# Patient Record
Sex: Female | Born: 1945 | Race: Black or African American | Hispanic: No | Smoking: Current every day smoker
Health system: Southern US, Community
[De-identification: ages and names within clinical notes are randomized; demographics above are authoritative.]

## PROBLEM LIST (undated history)

## (undated) DIAGNOSIS — G4730 Sleep apnea, unspecified: Secondary | ICD-10-CM

## (undated) DIAGNOSIS — F419 Anxiety disorder, unspecified: Secondary | ICD-10-CM

## (undated) DIAGNOSIS — K219 Gastro-esophageal reflux disease without esophagitis: Secondary | ICD-10-CM

## (undated) DIAGNOSIS — M1990 Unspecified osteoarthritis, unspecified site: Secondary | ICD-10-CM

## (undated) DIAGNOSIS — I10 Essential (primary) hypertension: Secondary | ICD-10-CM

## (undated) HISTORY — PX: HERNIA REPAIR: SHX51

## (undated) HISTORY — PX: BACK SURGERY: SHX140

## (undated) HISTORY — PX: CYST REMOVAL NECK: SHX6281

## (undated) HISTORY — PX: VERTEBRAL CORPECTOMY: SHX398

## (undated) HISTORY — PX: CARDIAC CATHETERIZATION: SHX172

---

## 2011-10-04 ENCOUNTER — Other Ambulatory Visit: Payer: Self-pay | Admitting: Neurosurgery

## 2011-10-04 DIAGNOSIS — M549 Dorsalgia, unspecified: Secondary | ICD-10-CM

## 2011-10-10 ENCOUNTER — Ambulatory Visit
Admission: RE | Admit: 2011-10-10 | Discharge: 2011-10-10 | Disposition: A | Discharge status: Home / Self Care | Payer: Medicare Other | Source: Ambulatory Visit | Attending: Neurosurgery | Admitting: Neurosurgery

## 2011-10-10 DIAGNOSIS — M549 Dorsalgia, unspecified: Secondary | ICD-10-CM

## 2011-10-10 MED ORDER — GADOBENATE DIMEGLUMINE 529 MG/ML IV SOLN
20.0000 mL | Freq: Once | INTRAVENOUS | Status: AC | PRN
  Administered 2011-10-10: 20 mL via INTRAVENOUS

## 2011-10-12 ENCOUNTER — Encounter (HOSPITAL_COMMUNITY): Payer: Self-pay | Admitting: Emergency Medicine

## 2011-10-12 ENCOUNTER — Emergency Department (HOSPITAL_COMMUNITY)
Admission: EM | Admit: 2011-10-12 | Discharge: 2011-10-12 | Disposition: A | Discharge status: Home / Self Care | Payer: Medicare Other | Attending: Emergency Medicine | Admitting: Emergency Medicine

## 2011-10-12 DIAGNOSIS — Z79899 Other long term (current) drug therapy: Secondary | ICD-10-CM | POA: Insufficient documentation

## 2011-10-12 DIAGNOSIS — I10 Essential (primary) hypertension: Secondary | ICD-10-CM | POA: Insufficient documentation

## 2011-10-12 DIAGNOSIS — E119 Type 2 diabetes mellitus without complications: Secondary | ICD-10-CM | POA: Insufficient documentation

## 2011-10-12 DIAGNOSIS — IMO0002 Reserved for concepts with insufficient information to code with codable children: Secondary | ICD-10-CM

## 2011-10-12 DIAGNOSIS — M545 Low back pain: Secondary | ICD-10-CM

## 2011-10-12 DIAGNOSIS — M5126 Other intervertebral disc displacement, lumbar region: Secondary | ICD-10-CM

## 2011-10-12 DIAGNOSIS — N289 Disorder of kidney and ureter, unspecified: Secondary | ICD-10-CM | POA: Insufficient documentation

## 2011-10-12 DIAGNOSIS — R52 Pain, unspecified: Secondary | ICD-10-CM | POA: Insufficient documentation

## 2011-10-12 DIAGNOSIS — Z9861 Coronary angioplasty status: Secondary | ICD-10-CM | POA: Insufficient documentation

## 2011-10-12 MED ORDER — METHOCARBAMOL 500 MG PO TABS
1000.0000 mg | ORAL_TABLET | Freq: Once | ORAL | Status: AC
  Administered 2011-10-12: 1000 mg via ORAL
  Filled 2011-10-12: qty 2

## 2011-10-12 MED ORDER — IBUPROFEN 800 MG PO TABS: 800.0000 mg | ORAL_TABLET | Freq: Three times a day (TID) | ORAL | Status: AC

## 2011-10-12 MED ORDER — METHYLPREDNISOLONE 4 MG PO KIT: PACK | ORAL | Status: AC

## 2011-10-12 MED ORDER — IBUPROFEN 400 MG PO TABS
800.0000 mg | ORAL_TABLET | Freq: Once | ORAL | Status: AC
  Administered 2011-10-12: 800 mg via ORAL
  Filled 2011-10-12: qty 2

## 2011-10-12 MED ORDER — OXYCODONE-ACETAMINOPHEN 5-325 MG PO TABS: 2.0000 | ORAL_TABLET | ORAL | Status: AC | PRN

## 2011-10-12 MED ORDER — HYDROMORPHONE HCL PF 1 MG/ML IJ SOLN
1.0000 mg | Freq: Once | INTRAMUSCULAR | Status: AC
  Administered 2011-10-12: 1 mg via INTRAMUSCULAR
  Filled 2011-10-12: qty 1

## 2011-10-12 NOTE — ED Notes (Signed)
Pulled Dilaudid and Ibuprofen.  Gave them to [place], Charity fundraiser to administer.  Patient currently in restroom.

## 2011-10-12 NOTE — ED Provider Notes (Signed)
History   This chart was scribed for Glynn Octave, MD by Toya Smothers. The patient was seen in room TR05C/TR05C. Patient's care was started at 1444.  CSN: [id number]  Arrival date & time 10/12/11  1444   First MD Initiated Contact with Patient 10/12/11 1504      Chief Complaint  Patient presents with  . Back Pain  . Leg Pain  . Knee Pain    Patient is a 66 y.o. female presenting with back pain, leg pain, and knee pain. The history is provided by the patient and a relative. No language interpreter was used.  Back Pain  Associated symptoms include leg pain. Pertinent negatives include no chest pain, no fever, no headaches, no abdominal pain, no dysuria and no weakness.  Leg Pain   Knee Pain Pertinent negatives include no chest pain, no abdominal pain, no headaches and no shortness of breath.    Brittany Poole is a 66 y.o. female who presents to the Emergency Department complaining of gradual onset moderate severe constant R sideback pain radiating down to leg, with associated leg pain, and knee pain onset 3 weeks ago. Symptoms have been gradually worsening in the past 2 days. Leg swelling has been worsenig for the past 2 weeks. Pt reports having increasing trouble walking. Pt reports that she had been taking hydrocodone with moderate relief, though she has ran out of her prescription and is currently without. Dr. Arletha Grippe had her on a prednisone taper of which she finished 2 weeks ago. H/o back surgery in 2009. Pt lists h/o diabetes, high blood pressure, and stent placements within the heart.  Pt reports that she saw Dr. Silvestre Mesi one month ago.   No past medical history on file.  No past surgical history on file.  No family history on file.  History  Substance Use Topics  . Smoking status: Not on file  . Smokeless tobacco: Not on file  . Alcohol Use: Not on file   Review of Systems  Constitutional: Negative for fever.  HENT: Negative for hearing loss, rhinorrhea and neck pain.     Eyes: Negative for pain.  Respiratory: Negative for cough and shortness of breath.   Cardiovascular: Positive for leg swelling. Negative for chest pain.  Gastrointestinal: Positive for vomiting. Negative for nausea, abdominal pain, diarrhea and rectal pain.  Genitourinary: Negative for dysuria.  Musculoskeletal: Positive for back pain, joint swelling (Ankles bilaterally), arthralgias (knee pain) and gait problem (necessitates cane.).  Skin: Negative for rash.  Neurological: Negative for weakness and headaches.    Allergies  Review of patient's allergies indicates no known allergies.  Home Medications   Current Outpatient Rx  Name Route Sig Dispense Refill  . AMITRIPTYLINE HCL 25 MG PO TABS Oral Take 25 mg by mouth at bedtime as needed. For legs    . ASPIRIN 325 MG PO TABS Oral Take 325 mg by mouth daily.    . ATENOLOL 25 MG PO TABS Oral Take 25 mg by mouth daily.    Marland Kitchen BENAZEPRIL-HYDROCHLOROTHIAZIDE 20-25 MG PO TABS Oral Take 1 tablet by mouth daily.    Marland Kitchen VITAMIN D 1000 UNITS PO TABS Oral Take 1,000 Units by mouth daily.    . CYCLOBENZAPRINE HCL 10 MG PO TABS Oral Take 10 mg by mouth 2 (two) times daily as needed. Back spasms    . MELOXICAM 15 MG PO TABS Oral Take 15 mg by mouth daily.    Marland Kitchen METFORMIN HCL 500 MG PO TABS Oral Take 500 mg by  mouth 2 (two) times daily with a meal.    . OXYCODONE-ACETAMINOPHEN 10-325 MG PO TABS Oral Take 0.5 tablets by mouth 2 (two) times daily as needed. For pain    . SIMVASTATIN 40 MG PO TABS Oral Take 40 mg by mouth every evening.    Marland Kitchen ZOLPIDEM TARTRATE 5 MG PO TABS Oral Take 5 mg by mouth at bedtime as needed. For sleep      BP 167/91  Pulse 90  Temp 98.3 F (36.8 C)  Resp 20  SpO2 98%  Physical Exam  Nursing note and vitals reviewed. Constitutional: She is oriented to person, place, and time. She appears well-developed and well-nourished. No distress.  HENT:  Head: Normocephalic and atraumatic.  Eyes: EOM are normal. Pupils are equal,  round, and reactive to light.  Neck: Neck supple. No tracheal deviation present.  Cardiovascular: Normal rate.   Pulmonary/Chest: Effort normal. No respiratory distress.  Abdominal: Soft. She exhibits no distension.  Musculoskeletal: Normal range of motion. She exhibits no edema.       R perispinal tenderness in to the butox. 5/5 strength bilaterally. No calf asymmetry or pain. Pitting edema of ankles bilaterally. Great toe extension int. Palpable pedal pulses.  Neurological: She is alert and oriented to person, place, and time. No sensory deficit.  Skin: Skin is warm and dry.  Psychiatric: She has a normal mood and affect. Her behavior is normal.    ED Course  Procedures (including critical care time) DIAGNOSTIC STUDIES: Oxygen Saturation is 98% on room air, normal by my interpretation.    COORDINATION OF CARE: 1502- Evaluated Pt. Pt is without distress.   Labs Reviewed - No data to display No results found.   No diagnosis found.    MDM  Acute on chronic R low back pain radiating into R leg.  Surgery in 2009, worse in the past 3 weeks, out of percocet given by PCP.  Saw Dr. Fabiola Backer and had MRI done 2 days ago.  No focal weakness, numbness, tingling, incontinence, fever, chills.  MRI 10/10/11: 1. Superior extrusion of a central disc herniation at L2-3  extending along the posterior body of L2 for 6 mm.  2. Mild central and left foraminal stenosis at L2-3.  3. Moderate central canal stenosis at L3-4 with lateral recess  narrowing bilaterally.  4. Moderate to severe right and moderate left foraminal stenosis  at L3-4.  5. Mild to moderate foraminal stenosis bilaterally at L4-5.  6. Status post L4 laminectomy with anterolisthesis and uncovering  of the disc at L4-5.  7. Mild lateral recess narrowing bilaterally at L5-S1.  8. T2 hyperintense non enhancing lesion of the right kidney likely  represents a cyst but is incompletely imaged.  Ambulatory in the ED with cane as she has  used for the past 3 weeks. No red flags on exam or indication for emergent repeat MRI.  Pain improved with medications. F/u Dr. Franky Macho as scheduled.     I personally performed the services described in this documentation, which was scribed in my presence.  The recorded information has been reviewed and considered.   Glynn Octave, MD 10/12/11 [phone]

## 2011-10-12 NOTE — ED Notes (Signed)
Pt complains of back leg and knee pain on right side, onset 8 weeks ago. Out of her painpills.

## 2011-10-12 NOTE — ED Notes (Signed)
Pt medicated, resting quietly at the time. Family at bedside. Pt alert and oriented x4. No signs of distress noted.

## 2011-10-18 ENCOUNTER — Other Ambulatory Visit: Payer: Self-pay | Admitting: Neurosurgery

## 2011-11-02 ENCOUNTER — Encounter (HOSPITAL_COMMUNITY): Payer: Self-pay | Admitting: Pharmacy Technician

## 2011-11-03 ENCOUNTER — Encounter (HOSPITAL_COMMUNITY)
Admission: RE | Admit: 2011-11-03 | Discharge: 2011-11-03 | Disposition: A | Discharge status: Home / Self Care | Payer: Medicare Other | Source: Ambulatory Visit | Attending: Neurosurgery | Admitting: Neurosurgery

## 2011-11-03 ENCOUNTER — Encounter (HOSPITAL_COMMUNITY): Payer: Self-pay

## 2011-11-03 HISTORY — DX: Essential (primary) hypertension: I10

## 2011-11-03 HISTORY — DX: Sleep apnea, unspecified: G47.30

## 2011-11-03 HISTORY — DX: Unspecified osteoarthritis, unspecified site: M19.90

## 2011-11-03 LAB — CBC
HCT: 37.9 % (ref 36.0–46.0)
MCV: 91.5 fL (ref 78.0–100.0)
Platelets: 246 10*3/uL (ref 150–400)
RBC: 4.14 MIL/uL (ref 3.87–5.11)
WBC: 9.4 10*3/uL (ref 4.0–10.5)

## 2011-11-03 LAB — TYPE AND SCREEN
ABO/RH(D): O POS
Antibody Screen: NEGATIVE

## 2011-11-03 LAB — BASIC METABOLIC PANEL
CO2: 28 mEq/L (ref 19–32)
Chloride: 100 mEq/L (ref 96–112)
Creatinine, Ser: 0.93 mg/dL (ref 0.50–1.10)

## 2011-11-03 LAB — SURGICAL PCR SCREEN: MRSA, PCR: NEGATIVE

## 2011-11-03 NOTE — Pre-Procedure Instructions (Signed)
20 Brittany Poole  11/03/2011   Your procedure is scheduled on:  11/10/11  Report to [facility] at 800 AM.  Call this number if you have problems the morning of surgery: [phone]   Remember:   Do not eat food:After Midnight.  May have clear liquids:until Midnight .  Clear liquids include soda, tea, black coffee, apple or grape juice, broth.  Take these medicines the morning of surgery with A SIP OF WATER: atenolol,flexeril,percocet   Do not wear jewelry, make-up or nail polish.  Do not wear lotions, powders, or perfumes. You may wear deodorant.  Do not shave 48 hours prior to surgery. Men may shave face and neck.  Do not bring valuables to the hospital.  Contacts, dentures or bridgework may not be worn into surgery.  Leave suitcase in the car. After surgery it may be brought to your room.  For patients admitted to the hospital, checkout time is 11:00 AM the day of discharge.   Patients discharged the day of surgery will not be allowed to drive home.  Name and phone number of your driver: family  Special Instructions: CHG Shower Use Special Wash: 1/2 bottle night before surgery and 1/2 bottle morning of surgery.   Please read over the following fact sheets that you were given: Pain Booklet, Coughing and Deep Breathing, Blood Transfusion Information, MRSA Information and Surgical Site Infection Prevention

## 2011-11-03 NOTE — Progress Notes (Signed)
Dr Judeth Porch in [place] called for current ekg,all cardiac tests

## 2011-11-09 MED ORDER — CEFAZOLIN SODIUM-DEXTROSE 2-3 GM-% IV SOLR
2.0000 g | INTRAVENOUS | Status: AC
  Administered 2011-11-10: 2 g via INTRAVENOUS
  Administered 2011-11-10: 1 g via INTRAVENOUS
  Filled 2011-11-09: qty 50

## 2011-11-10 ENCOUNTER — Encounter (HOSPITAL_COMMUNITY): Payer: Self-pay | Admitting: *Deleted

## 2011-11-10 ENCOUNTER — Inpatient Hospital Stay (HOSPITAL_COMMUNITY): Payer: Medicare Other

## 2011-11-10 ENCOUNTER — Inpatient Hospital Stay (HOSPITAL_COMMUNITY): Payer: Medicare Other | Admitting: *Deleted

## 2011-11-10 ENCOUNTER — Inpatient Hospital Stay (HOSPITAL_COMMUNITY)
Admission: RE | Admit: 2011-11-10 | Discharge: 2011-11-15 | DRG: 460 | Disposition: A | Discharge status: Home / Self Care | Payer: Medicare Other | Source: Ambulatory Visit | Attending: Neurosurgery | Admitting: Neurosurgery

## 2011-11-10 ENCOUNTER — Encounter (HOSPITAL_COMMUNITY)
Admission: RE | Disposition: A | Discharge status: Home / Self Care | Payer: Self-pay | Source: Ambulatory Visit | Attending: Neurosurgery

## 2011-11-10 DIAGNOSIS — M47817 Spondylosis without myelopathy or radiculopathy, lumbosacral region: Secondary | ICD-10-CM | POA: Diagnosis present

## 2011-11-10 DIAGNOSIS — I10 Essential (primary) hypertension: Secondary | ICD-10-CM | POA: Diagnosis present

## 2011-11-10 DIAGNOSIS — I2510 Atherosclerotic heart disease of native coronary artery without angina pectoris: Secondary | ICD-10-CM | POA: Diagnosis present

## 2011-11-10 DIAGNOSIS — F17200 Nicotine dependence, unspecified, uncomplicated: Secondary | ICD-10-CM | POA: Diagnosis present

## 2011-11-10 DIAGNOSIS — J449 Chronic obstructive pulmonary disease, unspecified: Secondary | ICD-10-CM | POA: Diagnosis present

## 2011-11-10 DIAGNOSIS — M4310 Spondylolisthesis, site unspecified: Principal | ICD-10-CM | POA: Diagnosis present

## 2011-11-10 DIAGNOSIS — M4316 Spondylolisthesis, lumbar region: Secondary | ICD-10-CM | POA: Diagnosis present

## 2011-11-10 DIAGNOSIS — J4489 Other specified chronic obstructive pulmonary disease: Secondary | ICD-10-CM | POA: Diagnosis present

## 2011-11-10 SURGERY — POSTERIOR LUMBAR FUSION 1 LEVEL
Anesthesia: General | Site: Spine Lumbar | Wound class: Clean

## 2011-11-10 MED ORDER — ONDANSETRON HCL 4 MG/2ML IJ SOLN: 4.0000 mg | Freq: Four times a day (QID) | INTRAMUSCULAR | Status: DC | PRN

## 2011-11-10 MED ORDER — ROCURONIUM BROMIDE 100 MG/10ML IV SOLN
INTRAVENOUS | Status: DC | PRN
  Administered 2011-11-10: 50 mg via INTRAVENOUS

## 2011-11-10 MED ORDER — HYDROMORPHONE HCL PF 1 MG/ML IJ SOLN
0.2500 mg | INTRAMUSCULAR | Status: DC | PRN
  Administered 2011-11-10 (×2): 0.5 mg via INTRAVENOUS

## 2011-11-10 MED ORDER — DIPHENHYDRAMINE HCL 12.5 MG/5ML PO ELIX
12.5000 mg | ORAL_SOLUTION | Freq: Four times a day (QID) | ORAL | Status: DC | PRN
  Filled 2011-11-10: qty 5

## 2011-11-10 MED ORDER — HYDROCODONE-ACETAMINOPHEN 5-325 MG PO TABS
1.0000 | ORAL_TABLET | ORAL | Status: DC | PRN
  Administered 2011-11-12: 2 via ORAL
  Filled 2011-11-10: qty 2

## 2011-11-10 MED ORDER — VECURONIUM BROMIDE 10 MG IV SOLR
INTRAVENOUS | Status: DC | PRN
  Administered 2011-11-10: 3 mg via INTRAVENOUS
  Administered 2011-11-10 (×3): 2 mg via INTRAVENOUS
  Administered 2011-11-10: 1 mg via INTRAVENOUS
  Administered 2011-11-10: 3 mg via INTRAVENOUS
  Administered 2011-11-10: 5 mg via INTRAVENOUS
  Administered 2011-11-10: 1 mg via INTRAVENOUS

## 2011-11-10 MED ORDER — NALOXONE HCL 0.4 MG/ML IJ SOLN: 0.4000 mg | INTRAMUSCULAR | Status: DC | PRN

## 2011-11-10 MED ORDER — THROMBIN 20000 UNITS EX KIT
PACK | CUTANEOUS | Status: DC | PRN
  Administered 2011-11-10: 10:00:00 via TOPICAL

## 2011-11-10 MED ORDER — BUPIVACAINE LIPOSOME 1.3 % IJ SUSP
20.0000 mL | Freq: Once | INTRAMUSCULAR | Status: DC
  Filled 2011-11-10: qty 20

## 2011-11-10 MED ORDER — MENTHOL 3 MG MT LOZG: 1.0000 | LOZENGE | OROMUCOSAL | Status: DC | PRN

## 2011-11-10 MED ORDER — DROPERIDOL 2.5 MG/ML IJ SOLN
INTRAMUSCULAR | Status: DC | PRN
  Administered 2011-11-10: 0.625 mg via INTRAVENOUS

## 2011-11-10 MED ORDER — NALOXONE HCL 0.4 MG/ML IJ SOLN
INTRAMUSCULAR | Status: DC | PRN
  Administered 2011-11-10: 0.1 mg via INTRAVENOUS

## 2011-11-10 MED ORDER — ALBUTEROL SULFATE HFA 108 (90 BASE) MCG/ACT IN AERS
INHALATION_SPRAY | RESPIRATORY_TRACT | Status: DC | PRN
  Administered 2011-11-10: 2 via RESPIRATORY_TRACT

## 2011-11-10 MED ORDER — VITAMIN D3 25 MCG (1000 UNIT) PO TABS
1000.0000 [IU] | ORAL_TABLET | Freq: Every day | ORAL | Status: DC
  Administered 2011-11-10 – 2011-11-15 (×6): 1000 [IU] via ORAL
  Filled 2011-11-10 (×6): qty 1

## 2011-11-10 MED ORDER — AMITRIPTYLINE HCL 25 MG PO TABS
25.0000 mg | ORAL_TABLET | Freq: Every evening | ORAL | Status: DC | PRN
  Filled 2011-11-10: qty 1

## 2011-11-10 MED ORDER — PROMETHAZINE HCL 25 MG/ML IJ SOLN: 6.2500 mg | INTRAMUSCULAR | Status: DC | PRN

## 2011-11-10 MED ORDER — MIDAZOLAM HCL 5 MG/5ML IJ SOLN
INTRAMUSCULAR | Status: DC | PRN
  Administered 2011-11-10 (×2): 1 mg via INTRAVENOUS

## 2011-11-10 MED ORDER — DIAZEPAM 5 MG PO TABS: 5.0000 mg | ORAL_TABLET | Freq: Four times a day (QID) | ORAL | Status: DC | PRN

## 2011-11-10 MED ORDER — HEPARIN SODIUM (PORCINE) 1000 UNIT/ML IJ SOLN
INTRAMUSCULAR | Status: AC
  Filled 2011-11-10: qty 1

## 2011-11-10 MED ORDER — ACETAMINOPHEN 325 MG PO TABS
650.0000 mg | ORAL_TABLET | ORAL | Status: DC | PRN
  Administered 2011-11-13 – 2011-11-14 (×2): 650 mg via ORAL
  Filled 2011-11-10 (×2): qty 2

## 2011-11-10 MED ORDER — SODIUM CHLORIDE 0.9 % IJ SOLN: 9.0000 mL | INTRAMUSCULAR | Status: DC | PRN

## 2011-11-10 MED ORDER — ONDANSETRON HCL 4 MG/2ML IJ SOLN
4.0000 mg | INTRAMUSCULAR | Status: DC | PRN
  Administered 2011-11-11 – 2011-11-12 (×2): 4 mg via INTRAVENOUS
  Filled 2011-11-10 (×2): qty 2

## 2011-11-10 MED ORDER — 0.9 % SODIUM CHLORIDE (POUR BTL) OPTIME
TOPICAL | Status: DC | PRN
  Administered 2011-11-10: 1000 mL

## 2011-11-10 MED ORDER — LACTATED RINGERS IV SOLN
INTRAVENOUS | Status: DC | PRN
  Administered 2011-11-10 (×4): via INTRAVENOUS

## 2011-11-10 MED ORDER — BENAZEPRIL-HYDROCHLOROTHIAZIDE 20-25 MG PO TABS: 1.0000 | ORAL_TABLET | Freq: Every day | ORAL | Status: DC

## 2011-11-10 MED ORDER — MEPERIDINE HCL 50 MG/ML IJ SOLN
INTRAMUSCULAR | Status: AC
  Administered 2011-11-10: 12.5 mg
  Filled 2011-11-10: qty 1

## 2011-11-10 MED ORDER — OXYCODONE-ACETAMINOPHEN 5-325 MG PO TABS
1.0000 | ORAL_TABLET | ORAL | Status: DC | PRN
  Administered 2011-11-12 – 2011-11-14 (×5): 2 via ORAL
  Administered 2011-11-15: 1 via ORAL
  Filled 2011-11-10 (×7): qty 2

## 2011-11-10 MED ORDER — SODIUM CHLORIDE 0.9 % IV SOLN
250.0000 mL | INTRAVENOUS | Status: DC
  Administered 2011-11-10: 250 mL via INTRAVENOUS

## 2011-11-10 MED ORDER — CEFAZOLIN SODIUM 1-5 GM-% IV SOLN
INTRAVENOUS | Status: AC
  Filled 2011-11-10: qty 50

## 2011-11-10 MED ORDER — SODIUM CHLORIDE 0.9 % IJ SOLN: 3.0000 mL | INTRAMUSCULAR | Status: DC | PRN

## 2011-11-10 MED ORDER — FENTANYL CITRATE 0.05 MG/ML IJ SOLN
INTRAMUSCULAR | Status: DC | PRN
  Administered 2011-11-10 (×2): 50 ug via INTRAVENOUS
  Administered 2011-11-10: 100 ug via INTRAVENOUS
  Administered 2011-11-10: 50 ug via INTRAVENOUS
  Administered 2011-11-10: 100 ug via INTRAVENOUS
  Administered 2011-11-10 (×2): 50 ug via INTRAVENOUS
  Administered 2011-11-10 (×2): 100 ug via INTRAVENOUS
  Administered 2011-11-10 (×6): 50 ug via INTRAVENOUS

## 2011-11-10 MED ORDER — LIDOCAINE HCL (CARDIAC) 20 MG/ML IV SOLN
INTRAVENOUS | Status: DC | PRN
  Administered 2011-11-10: 100 mg via INTRAVENOUS

## 2011-11-10 MED ORDER — ATENOLOL 25 MG PO TABS
25.0000 mg | ORAL_TABLET | Freq: Every day | ORAL | Status: DC
  Administered 2011-11-10 – 2011-11-15 (×4): 25 mg via ORAL
  Filled 2011-11-10 (×6): qty 1

## 2011-11-10 MED ORDER — METFORMIN HCL 500 MG PO TABS
500.0000 mg | ORAL_TABLET | Freq: Two times a day (BID) | ORAL | Status: DC
  Administered 2011-11-11 – 2011-11-15 (×8): 500 mg via ORAL
  Filled 2011-11-10 (×12): qty 1

## 2011-11-10 MED ORDER — MEPERIDINE HCL 25 MG/ML IJ SOLN
6.2500 mg | INTRAMUSCULAR | Status: DC | PRN
  Administered 2011-11-10 (×2): 12.5 mg via INTRAVENOUS

## 2011-11-10 MED ORDER — SODIUM CHLORIDE 0.9 % IJ SOLN
3.0000 mL | Freq: Two times a day (BID) | INTRAMUSCULAR | Status: DC
  Administered 2011-11-10 – 2011-11-12 (×4): 3 mL via INTRAVENOUS

## 2011-11-10 MED ORDER — ACETAMINOPHEN 10 MG/ML IV SOLN
1000.0000 mg | Freq: Four times a day (QID) | INTRAVENOUS | Status: AC
  Administered 2011-11-10 – 2011-11-11 (×4): 1000 mg via INTRAVENOUS
  Filled 2011-11-10 (×4): qty 100

## 2011-11-10 MED ORDER — SIMVASTATIN 40 MG PO TABS
40.0000 mg | ORAL_TABLET | Freq: Every evening | ORAL | Status: DC
  Administered 2011-11-10 – 2011-11-14 (×4): 40 mg via ORAL
  Filled 2011-11-10 (×6): qty 1

## 2011-11-10 MED ORDER — MORPHINE SULFATE 10 MG/ML IJ SOLN
INTRAMUSCULAR | Status: DC | PRN
Start: 2011-11-10 — End: 2011-11-10
  Administered 2011-11-10: 2 mg via INTRAVENOUS
  Administered 2011-11-10: 3 mg via INTRAVENOUS
  Administered 2011-11-10: 5 mg via INTRAVENOUS

## 2011-11-10 MED ORDER — PHENOL 1.4 % MT LIQD
1.0000 | OROMUCOSAL | Status: DC | PRN
  Filled 2011-11-10: qty 177

## 2011-11-10 MED ORDER — BENAZEPRIL HCL 20 MG PO TABS
20.0000 mg | ORAL_TABLET | Freq: Every day | ORAL | Status: DC
  Administered 2011-11-10 – 2011-11-15 (×4): 20 mg via ORAL
  Filled 2011-11-10 (×7): qty 1

## 2011-11-10 MED ORDER — POTASSIUM CHLORIDE IN NACL 20-0.9 MEQ/L-% IV SOLN
INTRAVENOUS | Status: DC
  Administered 2011-11-10 – 2011-11-11 (×2): via INTRAVENOUS
  Filled 2011-11-10 (×3): qty 1000

## 2011-11-10 MED ORDER — ALBUMIN HUMAN 5 % IV SOLN
INTRAVENOUS | Status: DC | PRN
  Administered 2011-11-10 (×2): via INTRAVENOUS

## 2011-11-10 MED ORDER — CEFAZOLIN SODIUM 1-5 GM-% IV SOLN
1.0000 g | Freq: Three times a day (TID) | INTRAVENOUS | Status: AC
  Administered 2011-11-10 – 2011-11-11 (×2): 1 g via INTRAVENOUS
  Filled 2011-11-10 (×2): qty 50

## 2011-11-10 MED ORDER — DIPHENHYDRAMINE HCL 50 MG/ML IJ SOLN: 12.5000 mg | Freq: Four times a day (QID) | INTRAMUSCULAR | Status: DC | PRN

## 2011-11-10 MED ORDER — HYDROMORPHONE HCL PF 1 MG/ML IJ SOLN
INTRAMUSCULAR | Status: AC
  Administered 2011-11-10: 0.5 mg via INTRAVENOUS
  Filled 2011-11-10: qty 1

## 2011-11-10 MED ORDER — HYDROCHLOROTHIAZIDE 25 MG PO TABS
25.0000 mg | ORAL_TABLET | Freq: Every day | ORAL | Status: DC
  Administered 2011-11-10 – 2011-11-15 (×4): 25 mg via ORAL
  Filled 2011-11-10 (×6): qty 1

## 2011-11-10 MED ORDER — BUPIVACAINE LIPOSOME 1.3 % IJ SUSP
INTRAMUSCULAR | Status: DC | PRN
  Administered 2011-11-10: 20 mL

## 2011-11-10 MED ORDER — PROPOFOL 10 MG/ML IV EMUL
INTRAVENOUS | Status: DC | PRN
  Administered 2011-11-10: 200 mg via INTRAVENOUS

## 2011-11-10 MED ORDER — LIDOCAINE-EPINEPHRINE 0.5 %-1:200000 IJ SOLN
INTRAMUSCULAR | Status: DC | PRN
  Administered 2011-11-10: 10 mL

## 2011-11-10 MED ORDER — ZOLPIDEM TARTRATE 5 MG PO TABS
5.0000 mg | ORAL_TABLET | Freq: Every evening | ORAL | Status: DC | PRN
  Administered 2011-11-12 – 2011-11-14 (×3): 5 mg via ORAL
  Filled 2011-11-10 (×3): qty 1

## 2011-11-10 MED ORDER — HYDROMORPHONE 0.3 MG/ML IV SOLN
INTRAVENOUS | Status: DC
  Administered 2011-11-10: 19:00:00 via INTRAVENOUS
  Administered 2011-11-11: 1.2 mg via INTRAVENOUS
  Administered 2011-11-11: 2.81 mg via INTRAVENOUS
  Administered 2011-11-11: 2.1 mg via INTRAVENOUS
  Administered 2011-11-11: 1.5 mg via INTRAVENOUS
  Administered 2011-11-12: 06:00:00 via INTRAVENOUS
  Administered 2011-11-12: 1.2 mg via INTRAVENOUS
  Administered 2011-11-12: 1.5 mg via INTRAVENOUS
  Administered 2011-11-12: 0.9 mg via INTRAVENOUS
  Filled 2011-11-10 (×2): qty 25

## 2011-11-10 MED ORDER — HYDROMORPHONE 0.3 MG/ML IV SOLN
INTRAVENOUS | Status: AC
  Filled 2011-11-10: qty 25

## 2011-11-10 MED ORDER — ACETAMINOPHEN 10 MG/ML IV SOLN
INTRAVENOUS | Status: AC
  Administered 2011-11-10: 1000 mg via INTRAVENOUS
  Filled 2011-11-10: qty 100

## 2011-11-10 MED ORDER — ACETAMINOPHEN 650 MG RE SUPP: 650.0000 mg | RECTAL | Status: DC | PRN

## 2011-11-10 MED ORDER — PHENYLEPHRINE HCL 10 MG/ML IJ SOLN
INTRAMUSCULAR | Status: DC | PRN
  Administered 2011-11-10: 40 ug via INTRAVENOUS

## 2011-11-10 MED ORDER — ONDANSETRON HCL 4 MG/2ML IJ SOLN
INTRAMUSCULAR | Status: DC | PRN
  Administered 2011-11-10: 4 mg via INTRAVENOUS

## 2011-11-10 SURGICAL SUPPLY — 72 items
BENZOIN TINCTURE PRP APPL 2/3 (GAUZE/BANDAGES/DRESSINGS) IMPLANT
BLADE SURG ROTATE 9660 (MISCELLANEOUS) IMPLANT
BONE MATRIX OSTEOCEL PLUS 10CC (Bone Implant) ×4 IMPLANT
BUR MATCHSTICK NEURO 3.0 LAGG (BURR) ×2 IMPLANT
CANISTER SUCTION 2500CC (MISCELLANEOUS) ×2 IMPLANT
CLOTH BEACON ORANGE TIMEOUT ST (SAFETY) ×2 IMPLANT
CONT SPEC 4OZ CLIKSEAL STRL BL (MISCELLANEOUS) ×4 IMPLANT
COVER BACK TABLE 24X17X13 BIG (DRAPES) IMPLANT
CROSSLINK 4.75X10 47-60MM (Plate) ×2 IMPLANT
DECANTER SPIKE VIAL GLASS SM (MISCELLANEOUS) ×2 IMPLANT
DERMABOND ADHESIVE PROPEN (GAUZE/BANDAGES/DRESSINGS) ×1
DERMABOND ADVANCED (GAUZE/BANDAGES/DRESSINGS) ×1
DERMABOND ADVANCED .7 DNX12 (GAUZE/BANDAGES/DRESSINGS) ×1 IMPLANT
DERMABOND ADVANCED .7 DNX6 (GAUZE/BANDAGES/DRESSINGS) ×1 IMPLANT
DRAPE C-ARM 42X72 X-RAY (DRAPES) ×4 IMPLANT
DRAPE LAPAROTOMY 100X72X124 (DRAPES) ×2 IMPLANT
DRAPE MICROSCOPE LEICA (MISCELLANEOUS) ×2 IMPLANT
DRAPE POUCH INSTRU U-SHP 10X18 (DRAPES) ×2 IMPLANT
DRAPE SURG 17X23 STRL (DRAPES) ×2 IMPLANT
DRESSING TELFA 8X3 (GAUZE/BANDAGES/DRESSINGS) IMPLANT
DURAPREP 26ML APPLICATOR (WOUND CARE) ×2 IMPLANT
ELECT REM PT RETURN 9FT ADLT (ELECTROSURGICAL) ×2
ELECTRODE REM PT RTRN 9FT ADLT (ELECTROSURGICAL) ×1 IMPLANT
GAUZE SPONGE 4X4 16PLY XRAY LF (GAUZE/BANDAGES/DRESSINGS) ×4 IMPLANT
GLOVE BIO SURGEON STRL SZ7 (GLOVE) ×4 IMPLANT
GLOVE BIOGEL M 8.0 STRL (GLOVE) ×2 IMPLANT
GLOVE BIOGEL PI IND STRL 7.0 (GLOVE) ×1 IMPLANT
GLOVE BIOGEL PI IND STRL 8 (GLOVE) ×2 IMPLANT
GLOVE BIOGEL PI INDICATOR 7.0 (GLOVE) ×1
GLOVE BIOGEL PI INDICATOR 8 (GLOVE) ×2
GLOVE ECLIPSE 6.5 STRL STRAW (GLOVE) ×4 IMPLANT
GLOVE ECLIPSE 7.5 STRL STRAW (GLOVE) ×8 IMPLANT
GLOVE EXAM NITRILE LRG STRL (GLOVE) IMPLANT
GLOVE EXAM NITRILE MD LF STRL (GLOVE) ×4 IMPLANT
GLOVE EXAM NITRILE XL STR (GLOVE) IMPLANT
GLOVE EXAM NITRILE XS STR PU (GLOVE) IMPLANT
GLOVE INDICATOR 6.5 STRL GRN (GLOVE) ×2 IMPLANT
GLOVE SKINSENSE NS SZ7.0 (GLOVE) ×1
GLOVE SKINSENSE STRL SZ7.0 (GLOVE) ×1 IMPLANT
GOWN BRE IMP SLV AUR LG STRL (GOWN DISPOSABLE) ×6 IMPLANT
GOWN BRE IMP SLV AUR XL STRL (GOWN DISPOSABLE) ×4 IMPLANT
GOWN STRL REIN 2XL LVL4 (GOWN DISPOSABLE) ×4 IMPLANT
KIT BASIN OR (CUSTOM PROCEDURE TRAY) ×2 IMPLANT
KIT POSITION SURG JACKSON T1 (MISCELLANEOUS) ×2 IMPLANT
KIT ROOM TURNOVER OR (KITS) ×2 IMPLANT
NEEDLE HYPO 25X1 1.5 SAFETY (NEEDLE) ×4 IMPLANT
NEEDLE SPNL 18GX3.5 QUINCKE PK (NEEDLE) IMPLANT
NS IRRIG 1000ML POUR BTL (IV SOLUTION) ×4 IMPLANT
PACK LAMINECTOMY NEURO (CUSTOM PROCEDURE TRAY) ×2 IMPLANT
PAD ARMBOARD 7.5X6 YLW CONV (MISCELLANEOUS) ×2 IMPLANT
PATTIES SURGICAL 1X1 (DISPOSABLE) ×2 IMPLANT
ROD SOLERA 60MM (Rod) ×2 IMPLANT
RUBBERBAND STERILE (MISCELLANEOUS) ×4 IMPLANT
SCREW MAS 6.5X45 (Screw) ×12 IMPLANT
SCREW SET SOLERA (Screw) ×6 IMPLANT
SCREW SET SOLERA TI (Screw) ×6 IMPLANT
SPACER OPAL 10X24 (Orthopedic Implant) ×4 IMPLANT
SPACER OPAL 10X24 12MM (Spacer) ×4 IMPLANT
SPONGE GAUZE 4X4 12PLY (GAUZE/BANDAGES/DRESSINGS) IMPLANT
SPONGE LAP 4X18 X RAY DECT (DISPOSABLE) IMPLANT
SPONGE SURGIFOAM ABS GEL 100 (HEMOSTASIS) ×2 IMPLANT
STRIP CLOSURE SKIN 1/2X4 (GAUZE/BANDAGES/DRESSINGS) IMPLANT
SUT PROLENE 6 0 BV (SUTURE) IMPLANT
SUT VIC AB 0 CT1 18XCR BRD8 (SUTURE) ×3 IMPLANT
SUT VIC AB 0 CT1 8-18 (SUTURE) ×3
SUT VIC AB 2-0 CT1 18 (SUTURE) ×4 IMPLANT
SUT VIC AB 3-0 SH 8-18 (SUTURE) ×6 IMPLANT
SYR 20ML ECCENTRIC (SYRINGE) ×2 IMPLANT
TOWEL OR 17X24 6PK STRL BLUE (TOWEL DISPOSABLE) ×2 IMPLANT
TOWEL OR 17X26 10 PK STRL BLUE (TOWEL DISPOSABLE) ×2 IMPLANT
TRAY FOLEY CATH 14FRSI W/METER (CATHETERS) ×2 IMPLANT
WATER STERILE IRR 1000ML POUR (IV SOLUTION) ×2 IMPLANT

## 2011-11-10 NOTE — Anesthesia Preprocedure Evaluation (Addendum)
Anesthesia Evaluation  Patient identified by MRN, date of birth, ID band Patient awake    Reviewed: Allergy & Precautions, H&P , NPO status , Patient's Chart, lab work & pertinent test results  Airway Mallampati: III TM Distance: >3 FB Neck ROM: Full    Dental  (+) Dental Advisory Given and Teeth Intact   Pulmonary sleep apnea , COPDCurrent Smoker,  + rhonchi   + decreased breath sounds      Cardiovascular hypertension, + CAD     Neuro/Psych  Neuromuscular disease    GI/Hepatic   Endo/Other  Morbid obesity  Renal/GU      Musculoskeletal   Abdominal (+) + obese,   Peds  Hematology   Anesthesia Other Findings   Reproductive/Obstetrics                          Anesthesia Physical Anesthesia Plan  ASA: III  Anesthesia Plan: General   Post-op Pain Management:    Induction: Intravenous  Airway Management Planned: Oral ETT  Additional Equipment:   Intra-op Plan:   Post-operative Plan: Extubation in OR  Informed Consent: I have reviewed the patients History and Physical, chart, labs and discussed the procedure including the risks, benefits and alternatives for the proposed anesthesia with the patient or authorized representative who has indicated his/her understanding and acceptance.     Plan Discussed with: CRNA and Surgeon  Anesthesia Plan Comments:         Anesthesia Quick Evaluation

## 2011-11-10 NOTE — Preoperative (Signed)
Beta Blockers   Reason not to administer Beta Blockers:Not Applicable, took this AM 

## 2011-11-10 NOTE — Transfer of Care (Signed)
Immediate Anesthesia Transfer of Care Note  Patient: Brittany Poole  Procedure(s) Performed: Procedure(s) (LRB): POSTERIOR LUMBAR FUSION 1 LEVEL (N/A)  Patient Location: PACU  Anesthesia Type: General  Level of Consciousness: awake, patient cooperative and responds to stimulation  Airway & Oxygen Therapy: Patient Spontanous Breathing and Patient connected to face mask oxygen  Post-op Assessment: Report given to PACU RN, Post -op Vital signs reviewed and stable and Patient moving all extremities X 4  Post vital signs: Reviewed and stable  Complications: No apparent anesthesia complications

## 2011-11-10 NOTE — H&P (Signed)
  BP 119/77  Pulse 77  Temp 97.1 F (36.2 C) (Oral)  Resp 20  SpO2 95% Mrs. 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 She has also developed significant spondylitic change at L3/4, and has a herniated disc at L2/3. She complains of severe back pain and lower extremity pain bilaterally.  Past Medical History  Diagnosis Date  . Diabetes mellitus   . Arthritis   . Sleep apnea     no cpap        last study 15 yrs ago  . Hypertension      [facility]    [place]   Past Surgical History  Procedure Date  . Cardiac catheterization     x4 stents  . Hernia repair   . Back surgery   . Vertebral corpectomy    No Known Allergies Prior to Admission medications   Medication Sig Start Date End Date Taking? Authorizing Provider  amitriptyline (ELAVIL) 25 MG tablet Take 25 mg by mouth at bedtime as needed. For legs   Yes Historical Provider, MD  aspirin 325 MG tablet Take 325 mg by mouth daily.   Yes Historical Provider, MD  atenolol (TENORMIN) 25 MG tablet Take 25 mg by mouth daily.   Yes Historical Provider, MD  benazepril-hydrochlorthiazide (LOTENSIN HCT) 20-25 MG per tablet Take 1 tablet by mouth daily.   Yes Historical Provider, MD  cholecalciferol (VITAMIN D) 1000 UNITS tablet Take 1,000 Units by mouth daily.   Yes Historical Provider, MD  cyclobenzaprine (FLEXERIL) 10 MG tablet Take 10 mg by mouth 2 (two) times daily as needed. Back spasms   Yes Historical Provider, MD  meloxicam (MOBIC) 15 MG tablet Take 15 mg by mouth daily.   Yes Historical Provider, MD  metFORMIN (GLUCOPHAGE) 500 MG tablet Take 500 mg by mouth 2 (two) times daily with a meal.   Yes Historical Provider, MD  oxyCODONE-acetaminophen (PERCOCET) 10-325 MG per tablet Take 0.5 tablets by mouth 2 (two) times daily as needed. For pain   Yes Historical Provider, MD  simvastatin (ZOCOR) 40 MG tablet Take 40 mg by mouth every evening.   Yes Historical  Provider, MD  zolpidem (AMBIEN) 5 MG tablet Take 5 mg by mouth at bedtime as needed. For sleep   Yes Historical Provider, MD   History   Social History  . Marital Status: Married    Spouse Name: N/A    Number of Children: N/A  . Years of Education: N/A   Occupational History  . Not on file.   Social History Main Topics  . Smoking status: Current Everyday Smoker -- 1.0 packs/day for 30 years    Types: Cigarettes  . Smokeless tobacco: Not on file  . Alcohol Use: No  . Drug Use: No  . Sexually Active:    Other Topics Concern  . Not on file   Social History Narrative  . No narrative on file    ROS Back pain She is alert and oriented x 4 weakness in the lower extremities. Depressed reflexes in the lower extremities. Lungs clear Heart rrr Perrl, full eom Symmetric facies, tongue and uvula midline Normal strength in the upper extremities. Antalgic gait Or for lumbar decompression and fusion with hardware. Discetomy and possible fusion at L2/3 Risks including bleeding infection, no pain relief, fusion failure, hardware failure, nerve damage, weakness, bowel bladder dysfunction, paralysis, and other risks were discussed. She has agreed to undergo the procedure.

## 2011-11-10 NOTE — Anesthesia Postprocedure Evaluation (Signed)
Anesthesia Post Note  Patient: Brittany Poole  Procedure(s) Performed: Procedure(s) (LRB): POSTERIOR LUMBAR FUSION 1 LEVEL (N/A)  Anesthesia type: general  Patient location: PACU  Post pain: Pain level controlled  Post assessment: Patient's Cardiovascular Status Stable  Last Vitals:  Filed Vitals:   11/10/11 1915  BP:   Pulse: 79  Temp:   Resp: 19    Post vital signs: Reviewed and stable  Level of consciousness: sedated  Complications: No apparent anesthesia complications

## 2011-11-10 NOTE — Op Note (Signed)
11/10/2011  5:43 PM  PATIENT:  Brittany Poole  66 y.o. 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 She has had no response to conservative treatment and has opted for operative decompression and fusion with pedicle screws.  PRE-OPERATIVE DIAGNOSIS:  spondylolisthesis lumbar spondylosis lumbar hernaited disc lumbar radiculopathy  POST-OPERATIVE DIAGNOSIS:  Spondylolisthesis,lumbar spondylosis,lumbar herniated disc,lumbar radiculopathy  PROCEDURE:  Procedure(s): 1.Posterolateral Arthrodesis L3-5, morselized auto and allograft(osteocel) 2. PLIF L3/4(27mm Peek opal cages), L4/5(46mm opal cages), filled with local autograft 3. Lumbar decompression bilateral at L3/4,4/5 beyond what is necessary for a Plif, for lateral decompression of the L3,4,and 5 roots in the neural foramina and complete medial facetectomies of L3, L4 bilaterally 4.Left L2/3 discetomy with microdissection via a semihemilaminectomy ofL1, and  L2  SURGEON:  Surgeon(s): Carmela Hurt, MD Karn Cassis, MD  ASSISTANTS:botero  ANESTHESIA:   general  EBL:  Total I/O In: 3710 [I.V.:3000; Blood:210; IV Piggyback:500] Out: 1145 [Urine:545; Blood:600]  BLOOD ADMINISTERED:210 CC PRBC  CELL SAVER GIVEN:yes  COUNTper nursing  DRAINS: none   SPECIMEN:  No Specimen  DICTATION: Mrs. Slutsky was brought to the operating room, intubated and placed under a general anesthetic without difficulty. She had a foley catheter placed under sterile conditions. She was rolled prone onto a Jackson table and all pressure points were properly padded. Her back was prepped and draped in a sterile manner. I infiltrated 10cc lidocaine into the proposed incision site in the lumbar region.  I opened the skin with a 10 blade and I took the incsision down to the thoracolumbar fascia. I exposed the lamina of L1,2,3,4,and remaining bone of 5 bilaterally. I used  xray to confirm my location.  I started my operation at L3/4 by performing a near complete laminectomy of L3 bilaterally. I worked slowly through scar to expose the disc space of L3/4 first on the left then the right. On both sides I was aggressive in removing the inferior facet of L3 to ensure that the L3 roots were free in their egress from the canal. I removed bone and ligament in excess of what i needed to perform a PLIF to get rid of the foraminal compromise and lateral recess stenosis. I used the drill and Kerrison punches to accomplish this, Having exposed the lateral aspect of the  disc I opened the space with a 15 blade. I used curettes, rongeurs, disc space shavers, disc space distractors, rasps, and Kerrison punches to remove disc material and to prepare the bony surfaces for the interbody cages. I sized the space and felt 12mm cages filled with local autograft would be best. I placed the cages without difficulty into the disc space protecting the thecal sac and L3 roots. I now turned my attention to the L4/5 space where there was a great deal more scar tissue as this was a redo level. I was able to define the lateral bony margins bilaterally with curved curettes. I used the drill and kerrison punches to remove laterally so that I could dissect the thecal sac away from the disc surface. I opened the disc space first on the right, then on the left. With patience I gained entry into the space and removed osteophytes overlying the disc with Kerrison punches.  I used curettes, rongeurs, disc space shavers, disc space distractors, rasps, and Kerrison punches to remove disc material and to prepare the bony surfaces for the interbody cages. I used fluoroscopy when I placed the cages  at L4/5 due to the listhesis. I was able to place 2 11mm cages at this level. The cages were packed with local autograft.  I removed bone and ligament in excess of what i needed to perform a PLIF to get rid of the foraminal  compromise and lateral recess stenosis. Surrounding the l4 and l5 roots.   I then performed a semihemilaminectomy of L2 and a small amount of L1 on the left. With the microscope in the operative field I dissected with microsurgical technique and removed disc from a subligamentous position rostral to the disc space. I did not enter the disc space parallel to the vertebral bodies. I did use a nerve hook to free any loose disc under the annulus. Satisfied with my decompression I covered the resection site with gelfoam.   I, with fluoroscopic guidance place six pedicle screws bilaterally at 3,4 and 5. I used the drill to create a pilot hole at each site, then sounded the pedicle with a metal probe. I assessed the integrity of each site with a narrow pedicle probe and appreciated no cutouts. I then tapped each hole with a 5.72mm tap, checked the pedicle holes once more and with no pedicular breaches placed my 6.44mmx45mm screws at each site. Fluoro showed the screws and cages to all be in good position.  I completed my arthrodesis by decorticating the transverse processes of L3,4 and 5 bilaterally then placing local autograft and Osteocel over the transverse processes.  I connected rods to the screw heads and secured them. I also placed a crosslink and secured it. Dr. Jeral Fruit helped with the screw placement, arthrodesis and subsequent closure.  We closed the wound with vicryl sutures approximating the thoracolumbar fascia, subcutaneous,and subcuticular layers. I used dermabond for a sterile dressing.  PLAN OF CARE: Admit to inpatient   PATIENT DISPOSITION:  PACU - hemodynamically stable.   Delay start of Pharmacological VTE agent (>24hrs) due to surgical blood loss or risk of bleeding:  yes

## 2011-11-11 LAB — CBC
HCT: 32.5 % — ABNORMAL LOW (ref 36.0–46.0)
Hemoglobin: 10.5 g/dL — ABNORMAL LOW (ref 12.0–15.0)
MCH: 29.9 pg (ref 26.0–34.0)
MCV: 92.6 fL (ref 78.0–100.0)
Platelets: 187 10*3/uL (ref 150–400)
RBC: 3.51 MIL/uL — ABNORMAL LOW (ref 3.87–5.11)
WBC: 11.9 10*3/uL — ABNORMAL HIGH (ref 4.0–10.5)

## 2011-11-11 LAB — BASIC METABOLIC PANEL
BUN: 9 mg/dL (ref 6–23)
CO2: 30 mEq/L (ref 19–32)
Calcium: 8.7 mg/dL (ref 8.4–10.5)
Chloride: 101 mEq/L (ref 96–112)
Creatinine, Ser: 0.79 mg/dL (ref 0.50–1.10)
Glucose, Bld: 161 mg/dL — ABNORMAL HIGH (ref 70–99)

## 2011-11-11 MED ORDER — PNEUMOCOCCAL VAC POLYVALENT 25 MCG/0.5ML IJ INJ
0.5000 mL | INJECTION | INTRAMUSCULAR | Status: AC
  Administered 2011-11-12: 0.5 mL via INTRAMUSCULAR
  Filled 2011-11-11: qty 0.5

## 2011-11-11 MED ORDER — DIAZEPAM 5 MG PO TABS
5.0000 mg | ORAL_TABLET | Freq: Four times a day (QID) | ORAL | Status: DC | PRN
  Administered 2011-11-11 – 2011-11-13 (×3): 5 mg via ORAL
  Filled 2011-11-11 (×5): qty 1

## 2011-11-11 NOTE — Progress Notes (Signed)
Postop day 1. Patient complains of back pain. Note some left anterior thigh numbness but denies any weakness. She denies any radicular pain. Complains of some lower abdominal discomfort no other complaints.  Borderline hypotensive throughout the night. Urine output marginal. Heart rate in normal range however. Patient is awake and alert. She is oriented and appropriate. Her motor examination is intact bilaterally. She is mild sensory loss in her left L3 versus L4 dermatome. Dressing is clean and dry. Her abdomen is soft but mildly tender. She is hypoactive bowel sounds.  Status post multilevel decompression and fusion. Progressing reasonably well. Will mobilize with therapy. Check labs today to assess why in status. Continue ICU observation for 1 more day.

## 2011-11-11 NOTE — Progress Notes (Signed)
Orthopedic Tech Progress Note Patient Details:  Brittany Poole Apr 23, 1945 [id number]  Patient ID: Brittany Poole, female   DOB: 29-Jul-1945, 66 y.o.   MRN: [id number] Contacted [employer] for brace order.  Louise Victory T 11/11/2011, 10:57 AM

## 2011-11-11 NOTE — Evaluation (Signed)
Physical Therapy Evaluation Patient Details Name: Brittany Poole MRN: [id number] DOB: Dec 31, 1945 Today's Date: 11/11/2011 Time: 1202-1232 PT Time Calculation (min): 30 min  PT Assessment / Plan / Recommendation Clinical Impression  Pt s/p PLF L3-4, L4-5. Pt with decreased BP since surgery although vitals stable throughout session with changes in position. Pt will benefit from skilled PT in the acute care setting in order to maximize functional mobiltiy and strength prior to d/c home    PT Assessment  Patient needs continued PT services    Follow Up Recommendations  Home health PT;Supervision for mobility/OOB       Equipment Recommendations  None recommended by PT       Frequency Min 5X/week    Precautions / Restrictions Precautions Precautions: Back Precaution Booklet Issued: Yes (comment) Precaution Comments: pt educated on 3/3 back precautions Required Braces or Orthoses: Spinal Brace Spinal Brace: Lumbar corset;Applied in sitting position Restrictions Weight Bearing Restrictions: No         Mobility  Bed Mobility Bed Mobility: Rolling Left;Left Sidelying to Sit;Sitting - Scoot to Edge of Bed Rolling Left: 3: Mod assist;With rail Left Sidelying to Sit: 3: Mod assist;HOB elevated;With rails Sitting - Scoot to Edge of Bed: 4: Min assist Details for Bed Mobility Assistance: VC for proper sequencing and technique to maintain back precautions during bed mobility Transfers Transfers: Sit to Stand;Stand to Sit Sit to Stand: 3: Mod assist;With upper extremity assist;From bed Stand to Sit: 4: Min assist;With upper extremity assist;To chair/3-in-1 Details for Transfer Assistance: VC for proper sequencing and hand placement for safety as well as maintaining back precautions during sit/stand. Min assist to control descent Ambulation/Gait Ambulation/Gait Assistance: 4: Min assist Ambulation Distance (Feet): 30 Feet Assistive device: Rolling walker Ambulation/Gait Assistance  Details: VC for upright posture and safety with RW as well as proper sequencing.  Gait Pattern: Step-to pattern;Trunk flexed;Antalgic;Decreased hip/knee flexion - right;Decreased hip/knee flexion - left;Decreased stride length Gait velocity: decreased gait speed    Exercises     PT Diagnosis: Difficulty walking;Acute pain  PT Problem List: Decreased strength;Decreased activity tolerance;Decreased mobility;Decreased knowledge of use of DME;Decreased safety awareness;Decreased knowledge of precautions;Pain PT Treatment Interventions: DME instruction;Gait training;Functional mobility training;Therapeutic activities;Neuromuscular re-education;Patient/family education   PT Goals Acute Rehab PT Goals PT Goal Formulation: With patient/family Time For Goal Achievement: 11/18/11 Potential to Achieve Goals: Fair Pt will Roll Supine to Right Side: with modified independence PT Goal: Rolling Supine to Right Side - Progress: Goal set today Pt will Roll Supine to Left Side: with modified independence PT Goal: Rolling Supine to Left Side - Progress: Goal set today Pt will go Supine/Side to Sit: with min assist PT Goal: Supine/Side to Sit - Progress: Goal set today Pt will go Sit to Supine/Side: with min assist PT Goal: Sit to Supine/Side - Progress: Goal set today Pt will go Sit to Stand: with supervision PT Goal: Sit to Stand - Progress: Goal set today Pt will go Stand to Sit: with supervision PT Goal: Stand to Sit - Progress: Goal set today Pt will Transfer Bed to Chair/Chair to Bed: with supervision PT Transfer Goal: Bed to Chair/Chair to Bed - Progress: Goal set today Pt will Ambulate: >150 feet;with supervision;with least restrictive assistive device PT Goal: Ambulate - Progress: Goal set today  Visit Information  Last PT Received On: 11/11/11 Assistance Needed: +1 (2 for lines)    Subjective Data      Prior Functioning  Home Living Lives With: Spouse Available Help at Discharge:  Family;Available 24  hours/day Type of Home: House Home Access: Level entry Home Layout: One level Bathroom Shower/Tub: Walk-in shower;Door [employer] Toilet: Standard Bathroom Accessibility: Yes How Accessible: Accessible via walker Home Adaptive Equipment: Straight cane;Walker - rolling;Crutches;Bedside commode/3-in-1;Grab bars in shower Prior Function Level of Independence: Independent Able to Take Stairs?: Yes Driving: Yes 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 Dominant Hand: Right    Cognition  Overall Cognitive Status: Appears within functional limits for tasks assessed/performed Arousal/Alertness: Awake/alert Orientation Level: Appears intact for tasks assessed Behavior During Session: [facility] for tasks performed    Extremity/Trunk Assessment Right Lower Extremity Assessment RLE ROM/Strength/Tone: Deficits RLE ROM/Strength/Tone Deficits: grossly 4/5 RLE Sensation: WFL - Light Touch Left Lower Extremity Assessment LLE ROM/Strength/Tone: Within functional levels LLE Sensation: WFL - Light Touch   Balance    End of Session PT - End of Session Equipment Utilized During Treatment: Gait belt;Back brace Activity Tolerance: Patient tolerated treatment well Patient left: in chair;with call bell/phone within reach;with family/visitor present Nurse Communication: Mobility status    Milana Kidney 11/11/2011, 1:43 PM  11/11/2011 Milana Kidney DPT PAGER: [phone] OFFICE: [phone]

## 2011-11-12 MED ORDER — DIPHENHYDRAMINE HCL 25 MG PO CAPS
25.0000 mg | ORAL_CAPSULE | Freq: Four times a day (QID) | ORAL | Status: DC | PRN
  Administered 2011-11-12: 25 mg via ORAL
  Filled 2011-11-12: qty 1

## 2011-11-12 MED ORDER — ONDANSETRON HCL 4 MG PO TABS
8.0000 mg | ORAL_TABLET | Freq: Three times a day (TID) | ORAL | Status: DC | PRN
  Administered 2011-11-15: 8 mg via ORAL
  Filled 2011-11-12: qty 2

## 2011-11-12 NOTE — Progress Notes (Signed)
Orthopedic Tech Progress Note Patient Details:  Brittany Poole [birth year] [id number]  Patient ID: Brittany Poole, female   DOB: 13-Nov-1945, 66 y.o.   MRN: [id number] Confirmed brace has been delivered to patient.  Abayomi Pattison T 11/12/2011, 11:52 AM

## 2011-11-12 NOTE — Progress Notes (Signed)
Physical Therapy Treatment Patient Details Name: Brittany Poole MRN: [id number] DOB: 1945/07/02 Today's Date: 11/12/2011 Time: 7829-5621 PT Time Calculation (min): 27 min  PT Assessment / Plan / Recommendation Comments on Treatment Session  pt presents with L3-5 PLIF.  pt moving better today and able to don back brace with only set-up.      Follow Up Recommendations  Home health PT;Supervision for mobility/OOB    Barriers to Discharge        Equipment Recommendations  None recommended by PT    Recommendations for Other Services    Frequency Min 5X/week   Plan Discharge plan remains appropriate;Frequency remains appropriate    Precautions / Restrictions Precautions Precautions: Back Precaution Booklet Issued: Yes (comment) Precaution Comments: pt educated on 3/3 back precautions Required Braces or Orthoses: Spinal Brace Spinal Brace: Lumbar corset;Applied in sitting position Restrictions Weight Bearing Restrictions: No   Pertinent Vitals/Pain Back is 8/10.  Pt using PCA.      Mobility  Bed Mobility Bed Mobility: Rolling Left;Left Sidelying to Sit;Sitting - Scoot to Edge of Bed;Sit to Sidelying Left Rolling Left: With rail;4: Min assist Left Sidelying to Sit: HOB elevated;With rails;4: Min assist Sitting - Scoot to Edge of Bed: 5: Supervision Sit to Sidelying Left: 3: Mod assist Details for Bed Mobility Assistance: VC for proper sequencing and technique to maintain back precautions during bed mobility Transfers Transfers: Sit to Stand;Stand to Sit Sit to Stand: 4: Min assist;With upper extremity assist;From bed Stand to Sit: 4: Min guard;With upper extremity assist;To bed Details for Transfer Assistance: cues for use of UEs and for back precautions.   Ambulation/Gait Ambulation/Gait Assistance: 4: Min guard Ambulation Distance (Feet): 100 Feet Assistive device: Rolling walker Ambulation/Gait Assistance Details: cues for upright posture, use of RW, back precautions with  turns.   Gait Pattern: Step-through pattern;Decreased stride length;Trunk flexed Stairs: No Wheelchair Mobility Wheelchair Mobility: No    Exercises     PT Diagnosis:    PT Problem List:   PT Treatment Interventions:     PT Goals Acute Rehab PT Goals Time For Goal Achievement: 11/18/11 Potential to Achieve Goals: Good PT Goal: Rolling Supine to Left Side - Progress: Progressing toward goal PT Goal: Supine/Side to Sit - Progress: Progressing toward goal PT Goal: Sit to Supine/Side - Progress: Progressing toward goal PT Goal: Sit to Stand - Progress: Progressing toward goal PT Goal: Stand to Sit - Progress: Progressing toward goal PT Goal: Ambulate - Progress: Progressing toward goal  Visit Information  Last PT Received On: 11/12/11 Assistance Needed: +1 (+2 helpful for lines)    Subjective Data  Subjective: I feel better standing than I do in that bed.     Cognition  Overall Cognitive Status: Appears within functional limits for tasks assessed/performed Arousal/Alertness: Awake/alert Orientation Level: Appears intact for tasks assessed Behavior During Session: [facility] for tasks performed    Balance  Balance Balance Assessed: No  End of Session PT - End of Session Equipment Utilized During Treatment: Gait belt;Back brace Activity Tolerance: Patient tolerated treatment well Patient left: in bed;with call bell/phone within reach;with family/visitor present Nurse Communication: Mobility status   GP     Sunny Schlein, [place] [phone] 11/12/2011, 10:47 AM

## 2011-11-12 NOTE — Plan of Care (Signed)
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

## 2011-11-12 NOTE — Progress Notes (Signed)
Day 2. Patient doing better today. Pain level much better controlled. No lower extremity pain. Still note some mild numbness into her anterior thighs left somewhat greater than right. Wound is healing well. Chest and abdomen benign. Motor examination intact bilaterally.   overall doing well. We will saline lock her IV and stop her PCA. Transfer to floor and continue efforts at mobilization with therapy. Hopefully home over the next day or 2.

## 2011-11-13 LAB — POCT I-STAT 4, (NA,K, GLUC, HGB,HCT)
Glucose, Bld: 144 mg/dL — ABNORMAL HIGH (ref 70–99)
Hemoglobin: 9.9 g/dL — ABNORMAL LOW (ref 12.0–15.0)
Potassium: 3.6 mEq/L (ref 3.5–5.1)

## 2011-11-13 LAB — POCT I-STAT GLUCOSE: Operator id: 305741

## 2011-11-13 NOTE — Progress Notes (Signed)
Physical Therapy Treatment Patient Details Name: Brittany Poole MRN: [id number] DOB: 1945-10-02 Today's Date: 11/13/2011 Time: 4098-1191 PT Time Calculation (min): 18 min  PT Assessment / Plan / Recommendation Comments on Treatment Session  Patient progressing very well with mobility today. Reviewed precautions with patient. Continue to work on [employer] with gait and mobility.     Follow Up Recommendations  Home health PT;Supervision for mobility/OOB    Barriers to Discharge        Equipment Recommendations  None recommended by PT    Recommendations for Other Services    Frequency Min 5X/week   Plan Discharge plan remains appropriate;Frequency remains appropriate    Precautions / Restrictions Precautions Precautions: Back Precaution Comments: Patient reeducated on 3/3 back precautions.  Required Braces or Orthoses: Spinal Brace Spinal Brace: Lumbar corset;Applied in sitting position   Pertinent Vitals/Pain 9/10 back pain. Patient was premedicated.     Mobility  Bed Mobility Bed Mobility: Rolling Right;Right Sidelying to Sit Rolling Right: 5: Supervision Right Sidelying to Sit: 5: Supervision;HOB flat Sit to Sidelying Left: 4: Min assist;HOB flat Details for Bed Mobility Assistance: Patient requiring A to lift LEs back up into bed. Otherwise, patient able to perfrom bed mobility with cueing for technique Transfers Sit to Stand: 4: Min guard;With upper extremity assist;From bed Stand to Sit: 4: Min guard;With upper extremity assist;To bed Details for Transfer Assistance: Cues for safe hand placement Ambulation/Gait Ambulation/Gait Assistance: 4: Min guard Ambulation Distance (Feet): 180 Feet Assistive device: Rolling walker Ambulation/Gait Assistance Details: Cues for RW management and to avoid obstacles.  Gait Pattern: Step-through pattern;Decreased stride length    Exercises     PT Diagnosis:    PT Problem List:   PT Treatment Interventions:     PT Goals Acute Rehab  PT Goals PT Goal: Rolling Supine to Right Side - Progress: Progressing toward goal PT Goal: Rolling Supine to Left Side - Progress: Progressing toward goal PT Goal: Supine/Side to Sit - Progress: Met PT Goal: Sit to Supine/Side - Progress: Met PT Goal: Sit to Stand - Progress: Progressing toward goal PT Goal: Stand to Sit - Progress: Progressing toward goal PT Transfer Goal: Bed to Chair/Chair to Bed - Progress: Progressing toward goal PT Goal: Ambulate - Progress: Progressing toward goal  Visit Information  Last PT Received On: 11/13/11 Assistance Needed: +1    Subjective Data      Cognition  Overall Cognitive Status: Appears within functional limits for tasks assessed/performed Arousal/Alertness: Awake/alert Orientation Level: Appears intact for tasks assessed Behavior During Session: [facility] for tasks performed    Balance     End of Session PT - End of Session Equipment Utilized During Treatment: Gait belt;Back brace Activity Tolerance: Patient tolerated treatment well Patient left: in bed;with call bell/phone within reach Nurse Communication: Mobility status   GP     [name] 11/13/2011, 10:12 AM

## 2011-11-13 NOTE — Progress Notes (Signed)
Patient ID: Brittany Poole, female   DOB: 11-30-1945, 66 y.o.   MRN: [id number] BP 122/60  Pulse 92  Temp 98.6 F (37 C) (Oral)  Resp 20  Ht 5\' 9"  (1.753 m)  Wt 122 kg (268 lb 15.4 oz)  BMI 39.72 kg/m2  SpO2 92% Alert and oriented x 4 Wound dressing intact, dry Moving lower extremities well, has been up with assistance Continue PT, does not appear to be a rehab candidate due to her progress Possible dc soon

## 2011-11-13 NOTE — Progress Notes (Signed)
Thank you for consult on Ms. Huffine.  Chart reviewed and note that patient was admitted on 11/10/11 for L3-L5 decompression.  Patient has been progressing well with therapies and was min assist for mobility yesterday.  HHPT recommended for follow up. Will defer CIR consult for now.

## 2011-11-13 NOTE — Progress Notes (Signed)
UR COMPLETED  

## 2011-11-13 NOTE — Evaluation (Signed)
Occupational Therapy Evaluation Patient Details Name: Brittany Poole MRN: [id number] DOB: 1945-06-28 Today's Date: 11/13/2011 Time: 4098-1191 OT Time Calculation (min): 24 min  OT Assessment / Plan / Recommendation Clinical Impression  Pt is recovering from L3-L5 decompression.  She is at at min guard to supervision level in mobility for ADL, set up for UB self care and dependent in LB.  Pt and son instructed in use and availability of equipment for LB ADL and will purchase for pt.  Pt will have 24 hour assist at home for ADL and IADL.     OT Assessment  Patient does not need any further OT services    Follow Up Recommendations  Supervision/Assistance - 24 hour    Barriers to Discharge      Equipment Recommendations  None recommended by OT    Recommendations for Other Services    Frequency       Precautions / Restrictions Precautions Precautions: Fall;Back Precaution Comments: Pt educated on back precautions related to ADL and IADL. Required Braces or Orthoses: Spinal Brace Spinal Brace: Lumbar corset;Applied in sitting position Restrictions Weight Bearing Restrictions: No   Pertinent Vitals/Pain     ADL  Eating/Feeding: Performed;Independent Where Assessed - Eating/Feeding: Chair Grooming: Performed;Wash/dry hands;Supervision/safety Where Assessed - Grooming: Unsupported standing Upper Body Bathing: Simulated;Set up Where Assessed - Upper Body Bathing: Unsupported sitting Lower Body Bathing: Simulated;Maximal assistance Where Assessed - Lower Body Bathing: Supported sit to stand Upper Body Dressing: Performed;Set up Where Assessed - Upper Body Dressing: Unsupported sitting Lower Body Dressing: Performed;Maximal assistance Where Assessed - Lower Body Dressing: Supported sit to stand Toilet Transfer: Research scientist (life sciences) Method: Sit to Barista: Comfort height toilet Toileting - Clothing Manipulation and Hygiene:  Performed;Modified independent Where Assessed - Toileting Clothing Manipulation and Hygiene: Sit to stand from 3-in-1 or toilet Equipment Used: Back brace;Rolling walker Transfers/Ambulation Related to ADLs: Pt ambulates with RW and supervision. ADL Comments: Instructed pt and her son in use of AE for LB ADL and household activities.  Son to purchase a Sports administrator and long sponge at discount store.  Pt choosing to have assist for socks and shoes.  Cautioned pt to avoid flip flops and opt for shoe with back on it and non slip sole.    OT Diagnosis:    OT Problem List:   OT Treatment Interventions:     OT Goals    Visit Information  Last OT Received On: 11/13/11 Assistance Needed: +1    Subjective Data  Subjective: "I got up by myself." Patient Stated Goal: Return home with assist of her family.   Prior Functioning  Vision/Perception  Home Living Lives With: Spouse Available Help at Discharge: Family;Available 24 hours/day Type of Home: House Home Access: Level entry Home Layout: One level Bathroom Shower/Tub: Walk-in shower;Door [employer] Toilet: Standard Bathroom Accessibility: Yes How Accessible: Accessible via walker Home Adaptive Equipment: Straight cane;Walker - rolling;Crutches;Bedside commode/3-in-1;Grab bars in shower Prior Function Level of Independence: Independent Able to Take Stairs?: Yes Driving: Yes Vocation: Retired Musician: No difficulties Dominant Hand: Right      Cognition  Overall Cognitive Status: Appears within functional limits for tasks assessed/performed Arousal/Alertness: Awake/alert Orientation Level: Appears intact for tasks assessed Behavior During Session: [facility] for tasks performed    Extremity/Trunk Assessment Right Upper Extremity Assessment RUE ROM/Strength/Tone: [facility] for tasks assessed Left Upper Extremity Assessment LUE ROM/Strength/Tone: [facility] for tasks assessed Trunk Assessment Trunk Assessment: Normal   Mobility  Bed Mobility Bed Mobility: Not assessed Details for Bed Mobility  Assistance: Pt already at EOB upon OTs arrival. Transfers Transfers: Sit to Stand;Stand to Sit Sit to Stand: 5: Supervision;With upper extremity assist;From bed;From toilet Stand to Sit: 4: Min guard;Without upper extremity assist;To chair/3-in-1 Details for Transfer Assistance: Cues for safe hand placement   Exercise    Balance    End of Session OT - End of Session Activity Tolerance: Patient tolerated treatment well Patient left: in chair;with call bell/phone within reach;with family/visitor present  GO     Evern Bio 11/13/2011, 12:55 PM [phone]

## 2011-11-13 NOTE — Progress Notes (Signed)
Consult for [facility] needs received. Patient states she has all the equipment she needs already at home:  wheelchair, crutches, rolling walker, bedside commode that also gets used in the shower, no steps or obstacles to the home. Patient states she would like to use the [facility] (?? unsure of name). This CM will contact HH for referral. HH company is [facility] [phone]). The referral for PT/OT has been made; I will fax over H&P, demographics, progress notes, PT notes.

## 2011-11-13 NOTE — Clinical Social Work Note (Signed)
CSW received consult for SNF. Pt was discussed during progression with RN. PT is recommending home health PT. CSW will alert RNCM. CSW is signing off as no further needs identified. Please reconsult if a need arises prior to discharge.   Dede Query, MSW, Theresia Majors [phone]

## 2011-11-14 LAB — GLUCOSE, CAPILLARY: Glucose-Capillary: 124 mg/dL — ABNORMAL HIGH (ref 70–99)

## 2011-11-14 MED FILL — Sodium Chloride Irrigation Soln 0.9%: Qty: 3000 | Status: AC

## 2011-11-14 MED FILL — Sodium Chloride IV Soln 0.9%: INTRAVENOUS | Qty: 1000 | Status: AC

## 2011-11-14 NOTE — Progress Notes (Signed)
Physical Therapy Treatment Patient Details Name: Brittany Poole MRN: [id number] DOB: [birth year] Today's Date: 11/14/2011 Time: 7829-5621 PT Time Calculation (min): 20 min  PT Assessment / Plan / Recommendation Comments on Treatment Session  Patient is progressing well with ambulation. I believe patient is safe to go home as she has assistance from family. Patient states she wants to be able to do more for herself. Patient is at supervision level for all mobility at this time. She is able to don/dof brace with no assistance.     Follow Up Recommendations  Home health PT;Supervision for mobility/OOB    Barriers to Discharge        Equipment Recommendations  None recommended by PT;None recommended by OT    Recommendations for Other Services    Frequency Min 5X/week   Plan Discharge plan remains appropriate;Frequency remains appropriate    Precautions / Restrictions Precautions Precautions: Back Precaution Comments: Patient able to recall precautions this session Spinal Brace: Lumbar corset;Applied in sitting position   Pertinent Vitals/Pain     Mobility  Bed Mobility Right Sidelying to Sit: 5: Supervision;HOB flat Sitting - Scoot to Edge of Bed: 6: Modified independent (Device/Increase time) Sit to Sidelying Left: 5: Supervision;With rail;HOB flat Details for Bed Mobility Assistance: Patient able to pull legs back up into bed today. Precautionary cues for no twisting.  Transfers Sit to Stand: 5: Supervision;With upper extremity assist;From bed Stand to Sit: With armrests;5: Supervision;To bed Details for Transfer Assistance: No cues needed. Supervision for safety Ambulation/Gait Ambulation/Gait Assistance: 5: Supervision Ambulation Distance (Feet): 250 Feet Assistive device: Rolling walker Ambulation/Gait Assistance Details: Cues to increase weight through LEs and take support off of UEs. Cues for upright posture Gait Pattern: Step-through pattern;Decreased stride length;Trunk  flexed Gait velocity: decreased but progressing    Exercises     PT Diagnosis:    PT Problem List:   PT Treatment Interventions:     PT Goals Acute Rehab PT Goals PT Goal: Rolling Supine to Right Side - Progress: Progressing toward goal PT Goal: Rolling Supine to Left Side - Progress: Progressing toward goal PT Goal: Supine/Side to Sit - Progress: Met PT Goal: Sit to Supine/Side - Progress: Met PT Goal: Sit to Stand - Progress: Met PT Goal: Stand to Sit - Progress: Met PT Transfer Goal: Bed to Chair/Chair to Bed - Progress: Met PT Goal: Ambulate - Progress: Progressing toward goal  Visit Information  Last PT Received On: 11/14/11 Assistance Needed: +1    Subjective Data      Cognition  Overall Cognitive Status: Appears within functional limits for tasks assessed/performed Arousal/Alertness: Awake/alert Orientation Level: Appears intact for tasks assessed Behavior During Session: [facility] for tasks performed    Balance     End of Session PT - End of Session Equipment Utilized During Treatment: Gait belt;Back brace Activity Tolerance: Patient tolerated treatment well Patient left: in bed;with call bell/phone within reach Nurse Communication: Mobility status   GP     [name] 11/14/2011, 8:32 AM 11/14/2011 [name] PTA [phone] pager [phone] office

## 2011-11-14 NOTE — Progress Notes (Signed)
Left message with Dr. Tobey Bride office inquiring about discharge plans for the patient. I was informed he was seeing patients and will probably round later today.

## 2011-11-14 NOTE — Progress Notes (Signed)
Spoke with charge nurse to address patient discharge and home health status. Waiting for rounding physician to arrive and write orders for home PT.

## 2011-11-14 NOTE — Progress Notes (Signed)
Patient ID: Brittany Poole, female   DOB: [birth year], 66 y.o.   MRN: [id number] BP 160/90  Pulse 89  Temp 100.7 F (38.2 C) (Oral)  Resp 20  Ht 5\' 9"  (1.753 m)  Wt 122 kg (268 lb 15.4 oz)  BMI 39.72 kg/m2  SpO2 98% Alert and oriented x 4 Speech clear and fluent.  BP has been elevated today. Will see what pain med will do first. Will give nursing clonidine as an option. Moving lower extremities well.  Mild fever, encourage activity.

## 2011-11-15 LAB — GLUCOSE, CAPILLARY

## 2011-11-15 MED ORDER — CYCLOBENZAPRINE HCL 10 MG PO TABS: 10.0000 mg | ORAL_TABLET | Freq: Three times a day (TID) | ORAL | Status: DC | PRN

## 2011-11-15 NOTE — Progress Notes (Signed)
Physical Therapy Treatment Patient Details Name: Brittany Poole MRN: [id number] DOB: 12/11/1945 Today's Date: 11/15/2011 Time: 4098-1191 PT Time Calculation (min): 16 min  PT Assessment / Plan / Recommendation Comments on Treatment Session  Patient has meet goals and is safe to DC when MD feels medically stable and appropriate    Follow Up Recommendations  Home health PT;Supervision for mobility/OOB    Barriers to Discharge        Equipment Recommendations  None recommended by PT    Recommendations for Other Services    Frequency     Plan All goals met and education completed, patient dischaged from PT services    Precautions / Restrictions Precautions Precautions: Back Precaution Comments: Patient able to recall and maintian with mobility Required Braces or Orthoses: Spinal Brace Spinal Brace: Lumbar corset;Applied in sitting position   Pertinent Vitals/Pain     Mobility  Bed Mobility Bed Mobility: Not assessed Transfers Sit to Stand: 6: Modified independent (Device/Increase time);From chair/3-in-1;With upper extremity assist;With armrests Stand to Sit: 6: Modified independent (Device/Increase time);To chair/3-in-1;With armrests;With upper extremity assist Ambulation/Gait Ambulation/Gait Assistance: 6: Modified independent (Device/Increase time) Ambulation Distance (Feet): 300 Feet Assistive device: Rolling walker Gait Pattern: Within Functional Limits    Exercises     PT Diagnosis:    PT Problem List:   PT Treatment Interventions:     PT Goals Acute Rehab PT Goals PT Goal: Sit to Stand - Progress: Met PT Goal: Stand to Sit - Progress: Met PT Goal: Ambulate - Progress: Met  Visit Information  Last PT Received On: 11/15/11 Assistance Needed: +1    Subjective Data      Cognition  Overall Cognitive Status: Appears within functional limits for tasks assessed/performed Arousal/Alertness: Awake/alert Orientation Level: Appears intact for tasks assessed Behavior  During Session: [facility] for tasks performed    Balance     End of Session PT - End of Session Equipment Utilized During Treatment: Gait belt;Back brace Activity Tolerance: Patient tolerated treatment well Patient left: in chair   GP     [name] 11/15/2011, 11:34 AM 11/15/2011 [name] PTA [phone] pager [phone] office

## 2011-11-15 NOTE — Progress Notes (Signed)
Faxed requested paperwork to [facility]. Patient has everything needed to effectively return to home.

## 2011-11-15 NOTE — Progress Notes (Signed)
Agree with d/c 11/15/2011 Milana Kidney DPT PAGER: [phone] OFFICE: [phone]

## 2011-11-15 NOTE — Discharge Summary (Signed)
Physician Discharge Summary  Patient ID: Brittany Poole MRN: [id number] DOB/AGE: 65-17-1947 65 y.o.  Admit date: 11/10/2011 Discharge date: 11/15/2011  Admission Diagnoses:spondylolisthesis L4/5, Lumbar stenosis L3-5, Lumbar HNP L2/3  Discharge Diagnoses: spondylolisthesis L4/5, Lumbar stenosis L3-5, Lumbar HNP L2/3 Principal Problem:  *Spondylolisthesis of lumbar region   Discharged Condition: good  Hospital Course: Brittany Poole was admitted to the hospital and taken to the operating room where she underwent 1.Posterolateral Arthrodesis L3-5, morselized auto and allograft(osteocel)  2. PLIF L3/4(31mm Peek opal cages), L4/5(83mm opal cages), filled with local autograft  3. Lumbar decompression bilateral at L3/4,4/5 beyond what is necessary for a Plif, for lateral decompression of the L3,4,and 5 roots in the neural foramina and complete medial facetectomies of L3, L4 bilaterally  4.Left L2/3 discetomy with microdissection via a semihemilaminectomy ofL1, and L2 Post op she has done very well. She has been seen by PT/OT and will receive home health treatment. At discharge she is still running a low grade fever but her wound does not appear infected. She has voided without difficulty, and is ambulating well.   Consults: None  Significant Diagnostic Studies: none  Treatments: surgery: 1.Posterolateral Arthrodesis L3-5, morselized auto and allograft(osteocel)  2. PLIF L3/4(23mm Peek opal cages), L4/5(19mm opal cages), filled with local autograft  3. Lumbar decompression bilateral at L3/4,4/5 beyond what is necessary for a Plif, for lateral decompression of the L3,4,and 5 roots in the neural foramina and complete medial facetectomies of L3, L4 bilaterally  4.Left L2/3 discetomy with microdissection via a semihemilaminectomy ofL1, and L2   Discharge Exam: Blood pressure 136/73, pulse 80, temperature 100.2 F (37.9 C), temperature source Oral, resp. rate 18, height 5\' 9"  (1.753 m), weight 122 kg  (268 lb 15.4 oz), SpO2 99.00%. General appearance: alert, cooperative and appears stated age Neurologic: Alert and oriented X 3, normal strength and tone. Normal symmetric reflexes. Normal coordination and gait  Disposition: 01-Home or Self Care  Discharge Orders    Future Orders Please Complete By Expires   Ambulatory referral to Home Health      Comments:   Please evaluate Brittany Poole for admission to [facility].  Disciplines requested: Physical Therapy  Services to provide: Other: physical therapy, occupational therapy  Physician to follow patient's care (the person listed here will be responsible for signing ongoing orders): Referring Provider  Requested Start of Care Date: Within 2-3 days  Special Instructions:  none     Medication List  As of 11/15/2011  1:54 PM   STOP taking these medications         meloxicam 15 MG tablet         TAKE these medications         amitriptyline 25 MG tablet   Commonly known as: ELAVIL   Take 25 mg by mouth at bedtime as needed. For legs      aspirin 325 MG tablet   Take 325 mg by mouth daily.      atenolol 25 MG tablet   Commonly known as: TENORMIN   Take 25 mg by mouth daily.      benazepril-hydrochlorthiazide 20-25 MG per tablet   Commonly known as: LOTENSIN HCT   Take 1 tablet by mouth daily.      cholecalciferol 1000 UNITS tablet   Commonly known as: VITAMIN D   Take 1,000 Units by mouth daily.      cyclobenzaprine 10 MG tablet   Commonly known as: FLEXERIL   Take 1 tablet (10 mg total) by mouth  3 (three) times daily as needed for muscle spasms. Back spasms      metFORMIN 500 MG tablet   Commonly known as: GLUCOPHAGE   Take 500 mg by mouth 2 (two) times daily with a meal.      oxyCODONE-acetaminophen 10-325 MG per tablet   Commonly known as: PERCOCET   Take 0.5 tablets by mouth 2 (two) times daily as needed. For pain      simvastatin 40 MG tablet   Commonly known as: ZOCOR   Take 40 mg by mouth every evening.        zolpidem 5 MG tablet   Commonly known as: AMBIEN   Take 5 mg by mouth at bedtime as needed. For sleep           Follow-up Information    Follow up with Linnie Delgrande L, MD in 1 month. (call to make appt )    Contact information:   [address] [place] [address] [phone]          Signed: Carmela Hurt 11/15/2011, 1:54 PM

## 2011-11-15 NOTE — Discharge Instructions (Signed)

## 2013-06-29 IMAGING — CR DG CHEST 2V
2 series · 2 of 2 positions shown · non-contrast
Comparison: None

CLINICAL DATA: Preop radiograph

CHEST - 2 VIEW

[view not recorded (1 of 2)]
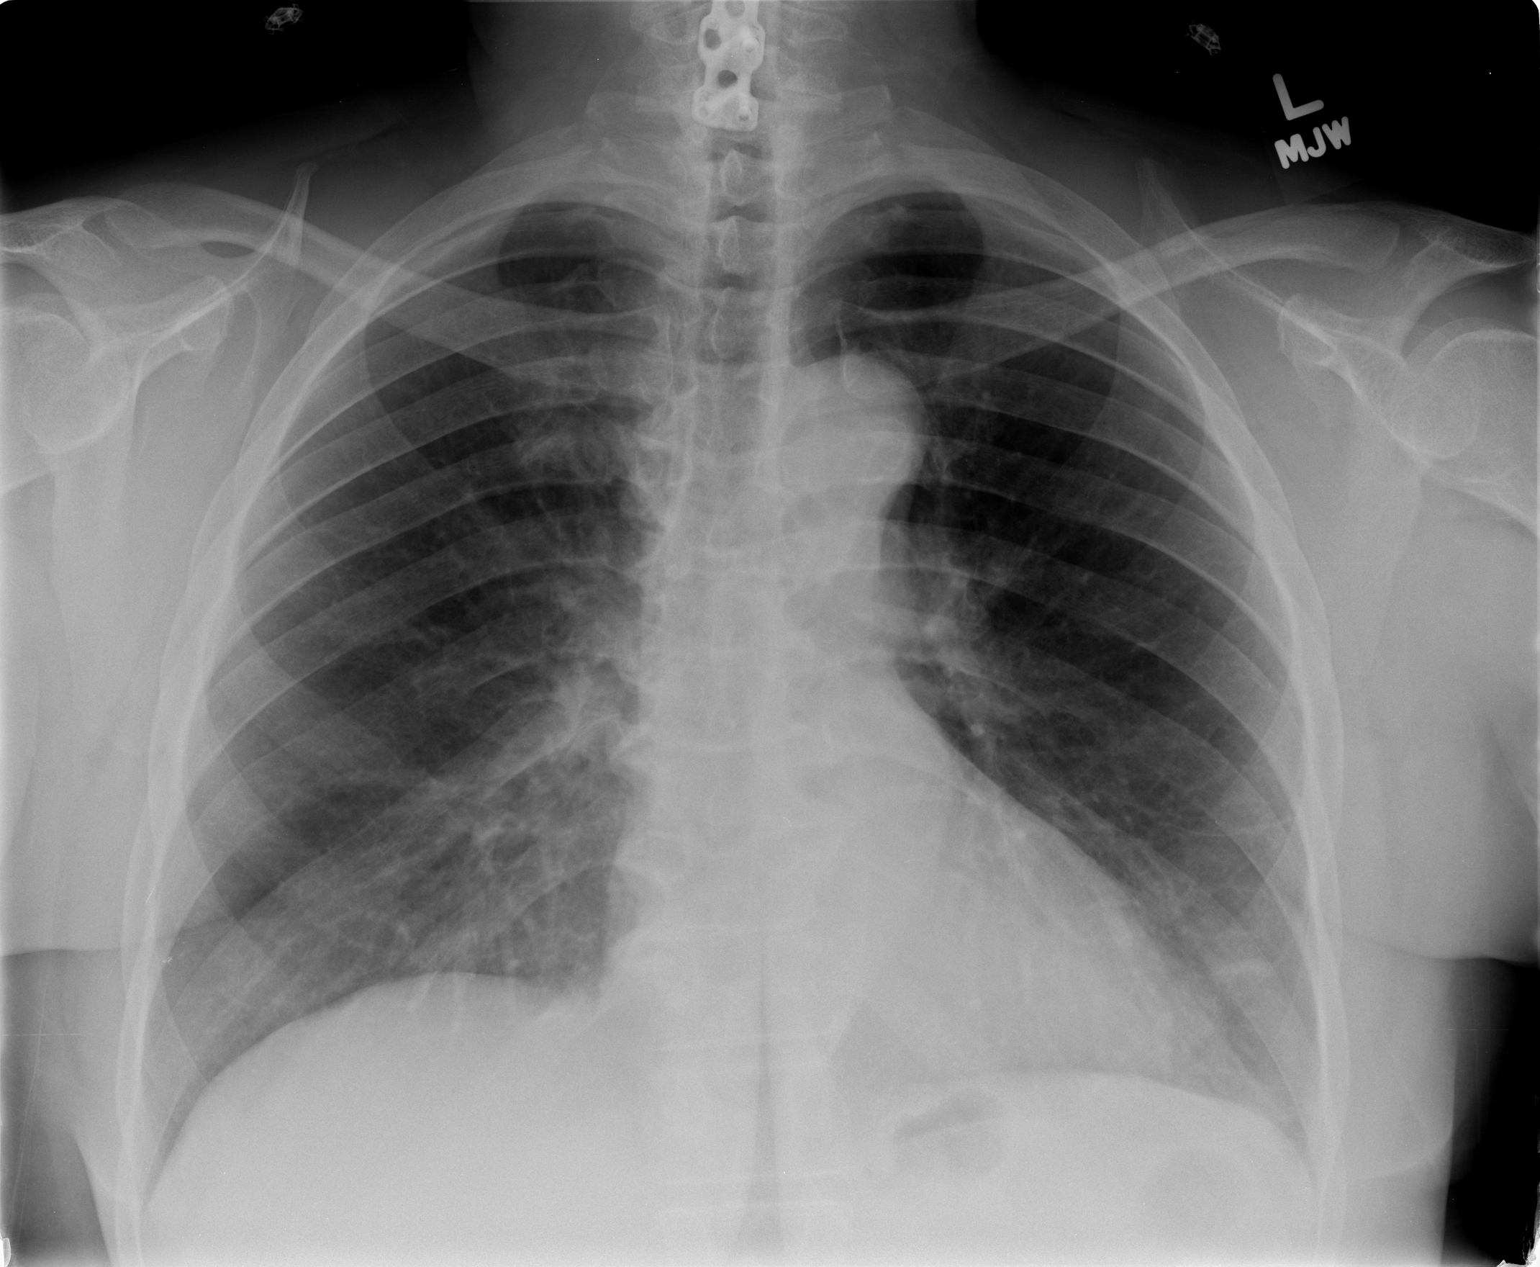

[view not recorded (2 of 2)]
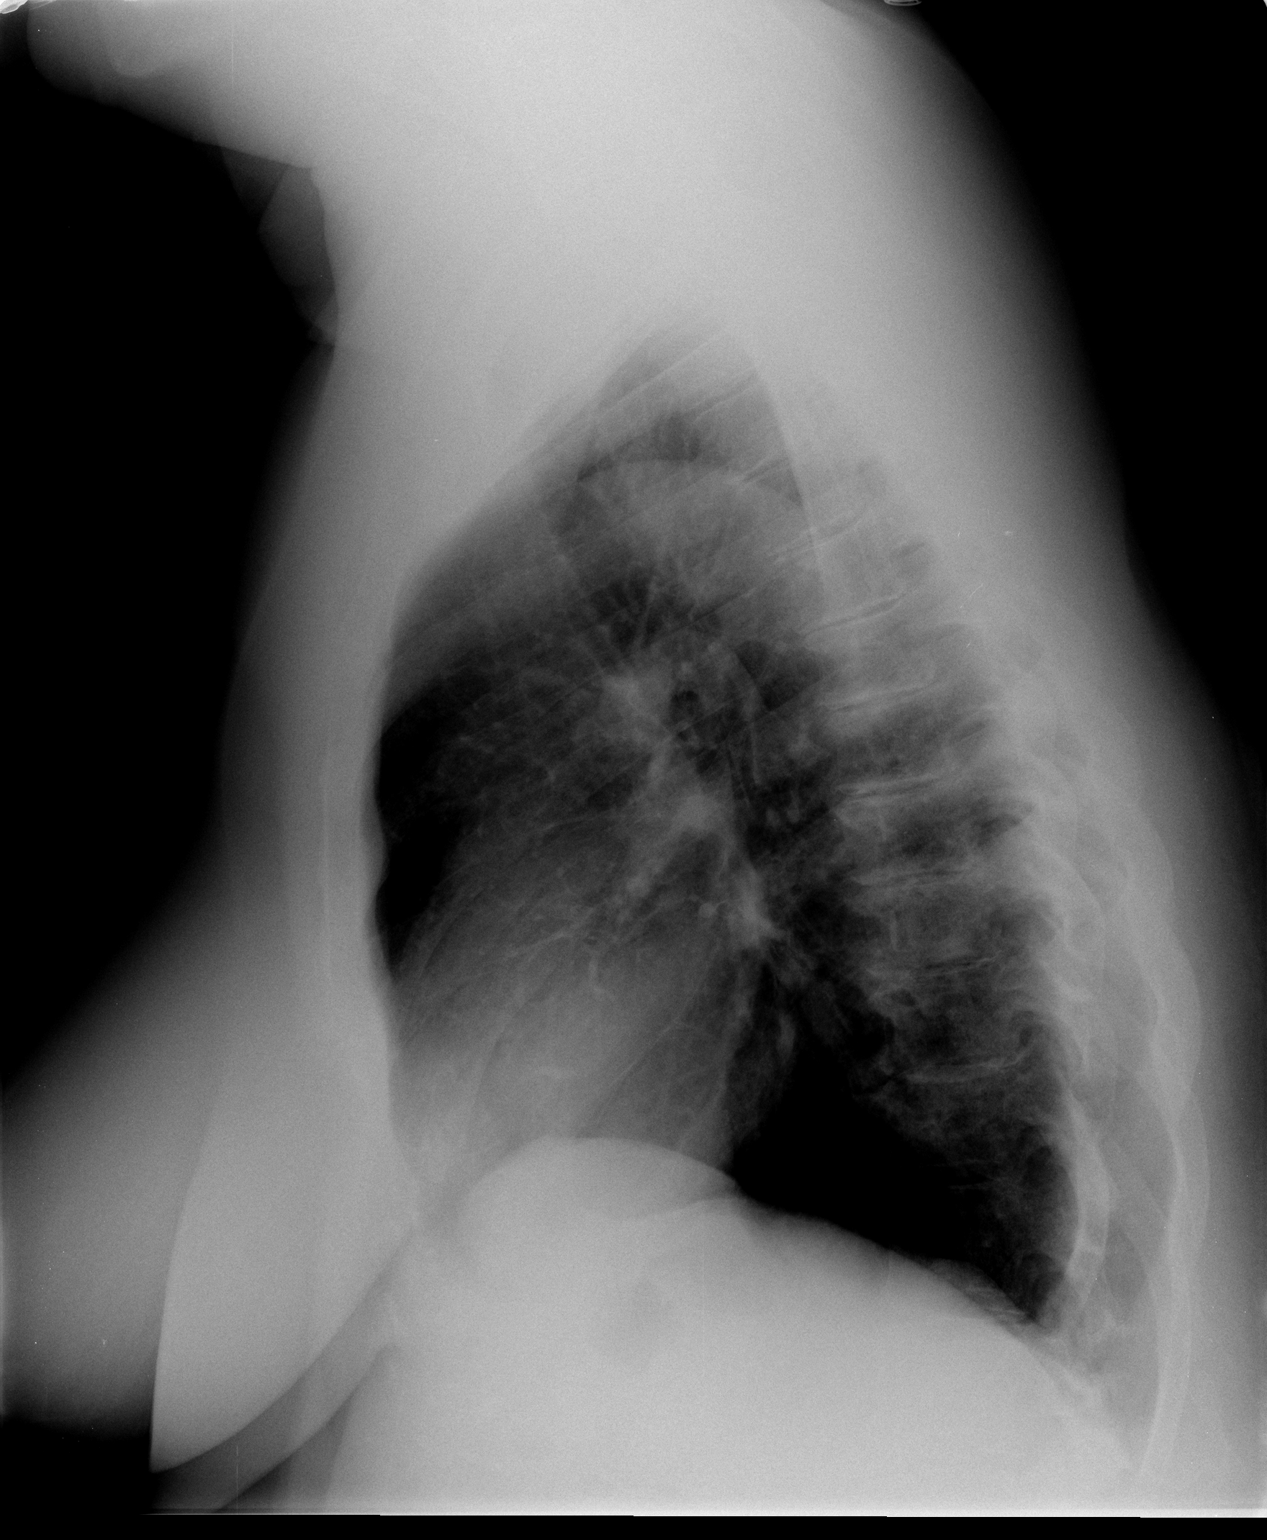

[2 of 2 positions shown; findings below may reference images not displayed]

FINDINGS: The heart size and mediastinal contours are within normal
limits.  No airspace consolidation.  Scar like densities noted in
the left base.  Both lungs are clear.  The visualized skeletal
structures are unremarkable.
IMPRESSION: No acute cardiopulmonary abnormalities.

## 2013-07-06 IMAGING — RF DG LUMBAR SPINE 2-3V
1 series · 2 of 2 positions shown · non-contrast
Comparison: 11/10/2011, [DATE] hours.

CLINICAL DATA: Posterior lumbar interbody fusion L3-L5.

DG C-ARM 1-60 MIN,LUMBAR SPINE - 2-3 VIEW
TECHNIQUE: AP and lateral intraoperative fluoroscopic spot films.

[Series 1: run · 2 of 2 slices shown]
[im 1/2]
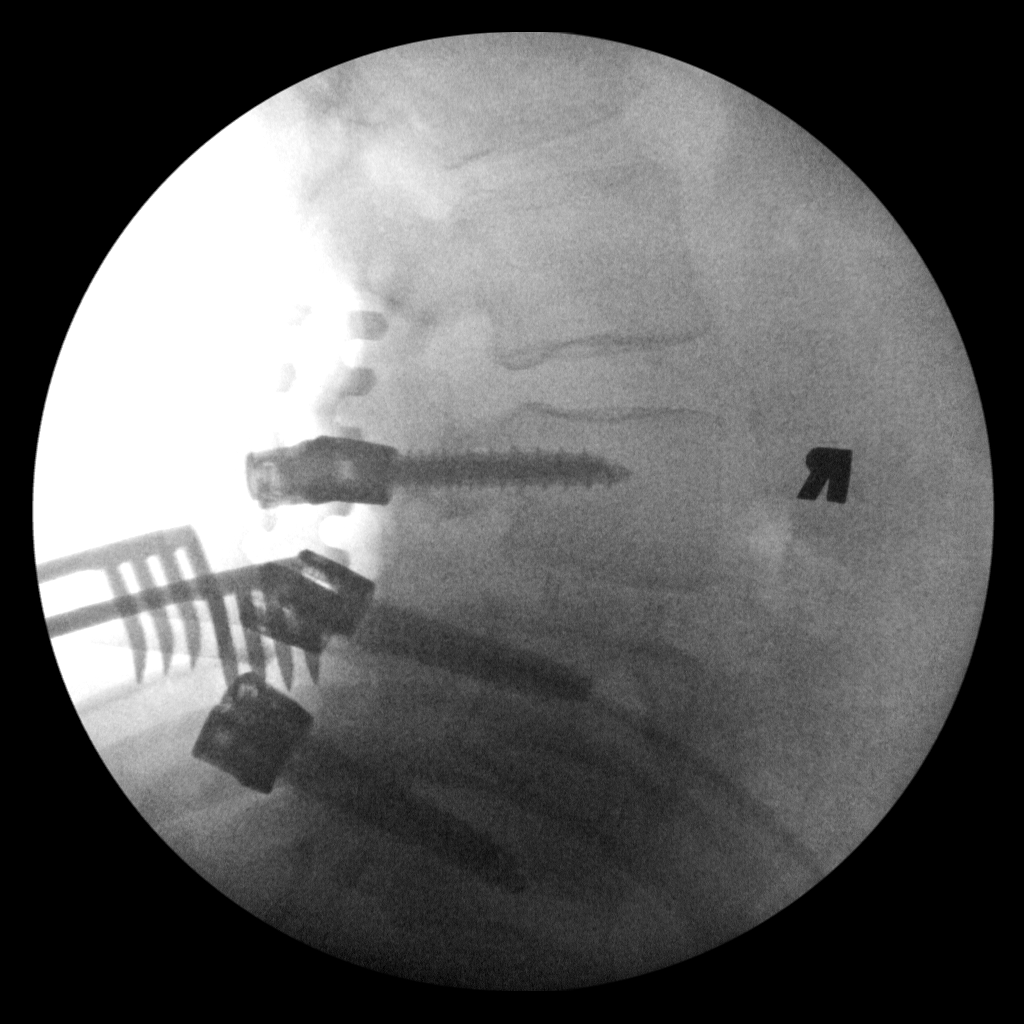
[im 2/2]
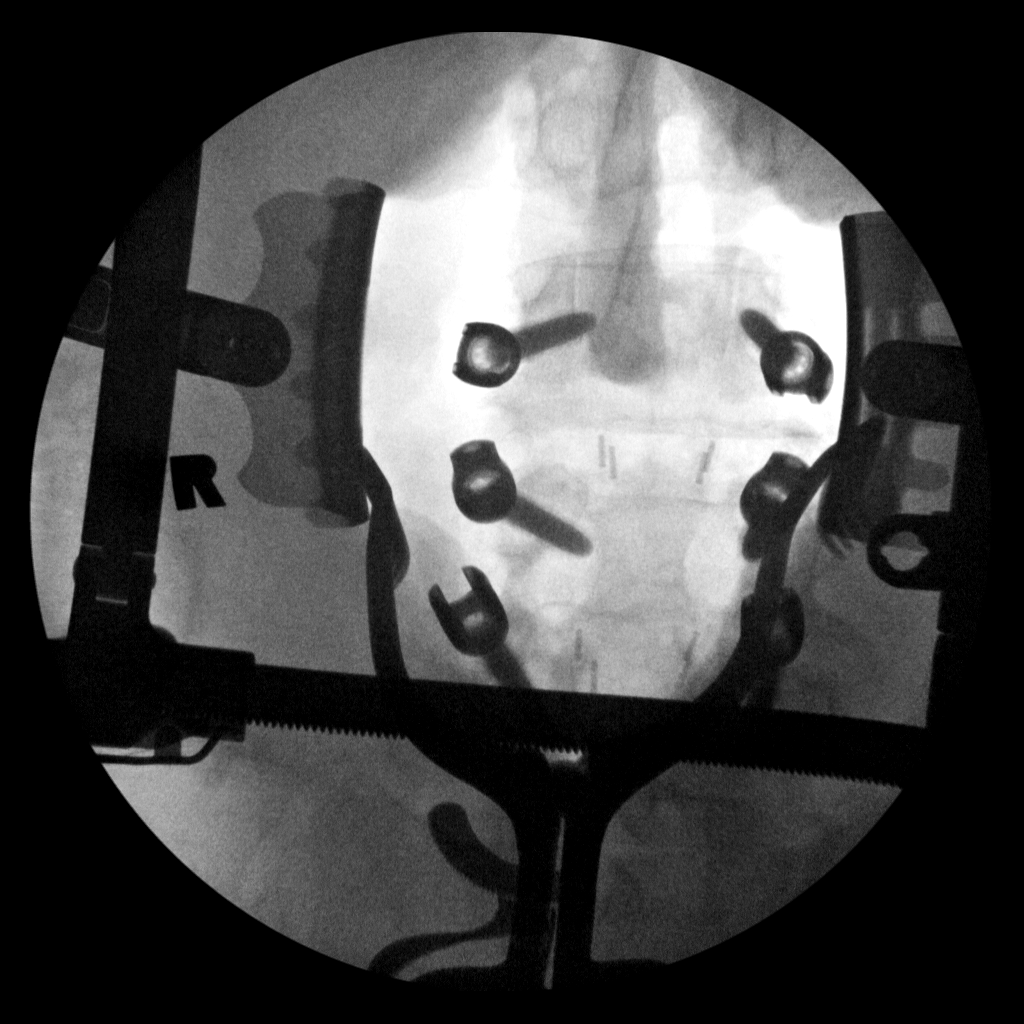

[2 of 2 positions shown; findings below may reference images not displayed]

FINDINGS: Spot films demonstrate pedicle screw placement from L3-L5
with discectomy and interbody spacer at these levels.
IMPRESSION: L3-L5 PLIF.

## 2015-01-20 ENCOUNTER — Other Ambulatory Visit: Payer: Self-pay | Admitting: Neurosurgery

## 2015-03-15 ENCOUNTER — Encounter (HOSPITAL_COMMUNITY)
Admission: RE | Admit: 2015-03-15 | Discharge: 2015-03-15 | Disposition: A | Discharge status: Home / Self Care | Payer: Medicare Other | Source: Ambulatory Visit | Attending: Neurosurgery | Admitting: Neurosurgery

## 2015-03-15 ENCOUNTER — Encounter (HOSPITAL_COMMUNITY): Payer: Self-pay

## 2015-03-15 DIAGNOSIS — M4316 Spondylolisthesis, lumbar region: Secondary | ICD-10-CM | POA: Insufficient documentation

## 2015-03-15 DIAGNOSIS — Z0183 Encounter for blood typing: Secondary | ICD-10-CM | POA: Insufficient documentation

## 2015-03-15 DIAGNOSIS — Z01812 Encounter for preprocedural laboratory examination: Secondary | ICD-10-CM | POA: Insufficient documentation

## 2015-03-15 HISTORY — DX: Anxiety disorder, unspecified: F41.9

## 2015-03-15 HISTORY — DX: Gastro-esophageal reflux disease without esophagitis: K21.9

## 2015-03-15 LAB — BASIC METABOLIC PANEL
ANION GAP: 6 (ref 5–15)
BUN: 7 mg/dL (ref 6–20)
CALCIUM: 10 mg/dL (ref 8.9–10.3)
CHLORIDE: 102 mmol/L (ref 101–111)
CO2: 32 mmol/L (ref 22–32)
Creatinine, Ser: 1.14 mg/dL — ABNORMAL HIGH (ref 0.44–1.00)
GFR calc non Af Amer: 48 mL/min — ABNORMAL LOW (ref >60)
GFR, EST AFRICAN AMERICAN: 56 mL/min — AB (ref >60)
Glucose, Bld: 118 mg/dL — ABNORMAL HIGH (ref 65–99)
Potassium: 3.6 mmol/L (ref 3.5–5.1)
Sodium: 140 mmol/L (ref 135–145)

## 2015-03-15 LAB — TYPE AND SCREEN
ABO/RH(D): O POS
ANTIBODY SCREEN: NEGATIVE

## 2015-03-15 LAB — CBC
HEMATOCRIT: 40.5 % (ref 36.0–46.0)
HEMOGLOBIN: 12.9 g/dL (ref 12.0–15.0)
MCH: 29.1 pg (ref 26.0–34.0)
MCHC: 31.9 g/dL (ref 30.0–36.0)
MCV: 91.4 fL (ref 78.0–100.0)
Platelets: 199 10*3/uL (ref 150–400)
RBC: 4.43 MIL/uL (ref 3.87–5.11)
RDW: 13.1 % (ref 11.5–15.5)
WBC: 7.9 10*3/uL (ref 4.0–10.5)

## 2015-03-15 LAB — SURGICAL PCR SCREEN
MRSA, PCR: NEGATIVE
STAPHYLOCOCCUS AUREUS: NEGATIVE

## 2015-03-15 LAB — GLUCOSE, CAPILLARY: Glucose-Capillary: 133 mg/dL — ABNORMAL HIGH (ref 65–99)

## 2015-03-15 NOTE — Progress Notes (Addendum)
Requested office notes from Dr. Newt Lukesobert Applegate (cardiologist at [facility] [phone]

## 2015-03-15 NOTE — Pre-Procedure Instructions (Signed)
Brittany Poole  03/15/2015      [id number] [place] [id number] [facility] [address] [place] [id number] Phone: [phone] Fax: [phone]    Your procedure is scheduled on   Wednesday  03/24/15  [facility] at [facility] A.M.  Call this number if you have problems the morning of surgery:  [phone]   Remember:  Do not eat food or drink liquids after midnight.  Take these medicines the morning of surgery with A SIP OF WATER  Atenolol(tenormin)   (STOP ASPIRIN, COUMADIN, PLAVIX, EFFIENT, HERBAL MEDICINES, meloxicam) How to Manage Your Diabetes Before Surgery   Why is it important to control my blood sugar before and after surgery?   Improving blood sugar levels before and after surgery helps healing and can limit problems.  A way of improving blood sugar control is eating a healthy diet by:  - Eating less sugar and carbohydrates  - Increasing activity/exercise  - Talk with your doctor about reaching your blood sugar goals  High blood sugars (greater than 180 mg/dL) can raise your risk of infections and slow down your recovery so you will need to focus on controlling your diabetes during the weeks before surgery.  Make sure that the doctor who takes care of your diabetes knows about your planned surgery including the date and location.  How do I manage my blood sugars before surgery?   Check your blood sugar at least 4 times a day, 2 days before surgery to make sure that they are not too high or low.   Check your blood sugar the morning of your surgery when you wake up and every 2               hours until you get to the Short-Stay unit.  If your blood sugar is less than 70 mg/dL, you will need to treat for low blood sugar by:  Treat a low blood sugar (less than 70 mg/dL) with 1/2 cup of clear juice (cranberry or apple), 4 glucose tablets, OR glucose gel.  Recheck  blood sugar in 15 minutes after treatment (to make sure it is greater than 70 mg/dL).  If blood sugar is not greater than 70 mg/dL on re-check, call [phone] for further instructions.   Report your blood sugar to the Short-Stay nurse when you get to Short-Stay.  References:  [facility], 2007 "How to Manage your Diabetes Before and After Surgery".  What do I do about my diabetes medications?   Do not take oral diabetes medicines (pills) the morning of surg     For patients with "Insulin Pumps":  Contact your diabetes doctor for specific instructions before surgery.   Decrease basal insulin rates by 20% at midnight the night before surgery.  Note that if your surgery is planned to be longer than 2 hours, your insulin pump will be removed and intravenous (IV) insulin will be started and managed by the nurses and anesthesiologist.  You will be able to restart your insulin pump once you are awake and able to manage it.  Make sure to bring insulin pump supplies to the hospital with you in case your site needs to be changed.        Do not wear jewelry, make-up or nail polish.  Do not wear lotions, powders, or perfumes.  You may wear deodorant.  Do not shave 48 hours prior to surgery.  Men may shave face and neck.  Do not bring valuables to the hospital.  [facility] is not responsible for any belongings or valuables.  Contacts, dentures or bridgework may not be worn into surgery.  Leave your suitcase in the car.  After surgery it may be brought to your room.  For patients admitted to the hospital, discharge time will be determined by your treatment team.  Patients discharged the day of surgery will not be allowed to drive home.   Name and phone number of your driver:   Special instructions: SEE PREPARING FOR SURGERY HANDOUT  Please read over the following fact sheets that you were given. Pain Booklet, Coughing and Deep Breathing, Blood Transfusion  Information, MRSA Information and Surgical Site Infection Prevention

## 2015-03-16 LAB — HEMOGLOBIN A1C
Hgb A1c MFr Bld: 7.8 % — ABNORMAL HIGH (ref 4.8–5.6)
Mean Plasma Glucose: 177 mg/dL

## 2015-03-24 ENCOUNTER — Inpatient Hospital Stay (HOSPITAL_COMMUNITY)
Admission: RE | Admit: 2015-03-24 | Discharge: 2015-03-27 | DRG: 460 | Disposition: A | Discharge status: Home / Self Care | Payer: Medicare Other | Source: Ambulatory Visit | Attending: Neurosurgery | Admitting: Neurosurgery

## 2015-03-24 ENCOUNTER — Inpatient Hospital Stay (HOSPITAL_COMMUNITY): Payer: Medicare Other

## 2015-03-24 ENCOUNTER — Encounter (HOSPITAL_COMMUNITY): Payer: Self-pay | Admitting: *Deleted

## 2015-03-24 ENCOUNTER — Inpatient Hospital Stay (HOSPITAL_COMMUNITY): Payer: Medicare Other | Admitting: Anesthesiology

## 2015-03-24 ENCOUNTER — Encounter (HOSPITAL_COMMUNITY)
Admission: RE | Disposition: A | Discharge status: Home / Self Care | Payer: Self-pay | Source: Ambulatory Visit | Attending: Neurosurgery

## 2015-03-24 DIAGNOSIS — M4806 Spinal stenosis, lumbar region: Secondary | ICD-10-CM | POA: Diagnosis present

## 2015-03-24 DIAGNOSIS — E119 Type 2 diabetes mellitus without complications: Secondary | ICD-10-CM | POA: Diagnosis present

## 2015-03-24 DIAGNOSIS — K219 Gastro-esophageal reflux disease without esophagitis: Secondary | ICD-10-CM | POA: Diagnosis present

## 2015-03-24 DIAGNOSIS — M479 Spondylosis, unspecified: Secondary | ICD-10-CM | POA: Diagnosis present

## 2015-03-24 DIAGNOSIS — G4730 Sleep apnea, unspecified: Secondary | ICD-10-CM | POA: Diagnosis present

## 2015-03-24 DIAGNOSIS — M48062 Spinal stenosis, lumbar region with neurogenic claudication: Secondary | ICD-10-CM | POA: Diagnosis present

## 2015-03-24 DIAGNOSIS — M4316 Spondylolisthesis, lumbar region: Secondary | ICD-10-CM | POA: Diagnosis present

## 2015-03-24 DIAGNOSIS — F17210 Nicotine dependence, cigarettes, uncomplicated: Secondary | ICD-10-CM | POA: Diagnosis present

## 2015-03-24 DIAGNOSIS — I10 Essential (primary) hypertension: Secondary | ICD-10-CM | POA: Diagnosis present

## 2015-03-24 DIAGNOSIS — Z419 Encounter for procedure for purposes other than remedying health state, unspecified: Secondary | ICD-10-CM

## 2015-03-24 LAB — GLUCOSE, CAPILLARY
GLUCOSE-CAPILLARY: 152 mg/dL — AB (ref 65–99)
GLUCOSE-CAPILLARY: 182 mg/dL — AB (ref 65–99)
Glucose-Capillary: 152 mg/dL — ABNORMAL HIGH (ref 65–99)
Glucose-Capillary: 154 mg/dL — ABNORMAL HIGH (ref 65–99)

## 2015-03-24 SURGERY — POSTERIOR LUMBAR FUSION 1 LEVEL
Anesthesia: General

## 2015-03-24 MED ORDER — SODIUM CHLORIDE 0.9 % IJ SOLN
INTRAMUSCULAR | Status: AC
  Filled 2015-03-24: qty 10

## 2015-03-24 MED ORDER — MIDAZOLAM HCL 2 MG/2ML IJ SOLN
INTRAMUSCULAR | Status: AC
  Filled 2015-03-24: qty 2

## 2015-03-24 MED ORDER — CEFAZOLIN SODIUM-DEXTROSE 2-3 GM-% IV SOLR
INTRAVENOUS | Status: AC
  Administered 2015-03-24 (×2): 2 g via INTRAVENOUS
  Filled 2015-03-24: qty 50

## 2015-03-24 MED ORDER — MIDAZOLAM HCL 5 MG/5ML IJ SOLN
INTRAMUSCULAR | Status: DC | PRN
  Administered 2015-03-24: 2 mg via INTRAVENOUS

## 2015-03-24 MED ORDER — LIDOCAINE-EPINEPHRINE 0.5 %-1:200000 IJ SOLN
INTRAMUSCULAR | Status: DC | PRN
  Administered 2015-03-24: 50 mL

## 2015-03-24 MED ORDER — DOCUSATE SODIUM 100 MG PO CAPS
100.0000 mg | ORAL_CAPSULE | Freq: Two times a day (BID) | ORAL | Status: DC
  Administered 2015-03-24 – 2015-03-27 (×6): 100 mg via ORAL
  Filled 2015-03-24 (×6): qty 1

## 2015-03-24 MED ORDER — POTASSIUM CHLORIDE IN NACL 20-0.9 MEQ/L-% IV SOLN
INTRAVENOUS | Status: DC
  Administered 2015-03-24: 22:00:00 via INTRAVENOUS
  Filled 2015-03-24 (×7): qty 1000

## 2015-03-24 MED ORDER — METFORMIN HCL 500 MG PO TABS
500.0000 mg | ORAL_TABLET | Freq: Two times a day (BID) | ORAL | Status: DC
  Administered 2015-03-25 – 2015-03-27 (×5): 500 mg via ORAL
  Filled 2015-03-24 (×5): qty 1

## 2015-03-24 MED ORDER — MORPHINE SULFATE (PF) 2 MG/ML IV SOLN
1.0000 mg | INTRAVENOUS | Status: DC | PRN
  Administered 2015-03-24 – 2015-03-25 (×2): 2 mg via INTRAVENOUS
  Filled 2015-03-24 (×2): qty 1

## 2015-03-24 MED ORDER — ATENOLOL 25 MG PO TABS
25.0000 mg | ORAL_TABLET | Freq: Every day | ORAL | Status: DC
  Administered 2015-03-25 – 2015-03-27 (×3): 25 mg via ORAL
  Filled 2015-03-24 (×3): qty 1

## 2015-03-24 MED ORDER — ROCURONIUM BROMIDE 50 MG/5ML IV SOLN
INTRAVENOUS | Status: AC
  Filled 2015-03-24: qty 1

## 2015-03-24 MED ORDER — HYDROCODONE-ACETAMINOPHEN 5-325 MG PO TABS
1.0000 | ORAL_TABLET | ORAL | Status: DC | PRN
  Administered 2015-03-25: 2 via ORAL
  Filled 2015-03-24: qty 2

## 2015-03-24 MED ORDER — FENTANYL CITRATE (PF) 250 MCG/5ML IJ SOLN
INTRAMUSCULAR | Status: AC
  Filled 2015-03-24: qty 5

## 2015-03-24 MED ORDER — CEFAZOLIN SODIUM 1-5 GM-% IV SOLN
1.0000 g | Freq: Three times a day (TID) | INTRAVENOUS | Status: AC
  Administered 2015-03-25: 1 g via INTRAVENOUS
  Filled 2015-03-24 (×2): qty 50

## 2015-03-24 MED ORDER — SODIUM CHLORIDE 0.9 % IJ SOLN
3.0000 mL | Freq: Two times a day (BID) | INTRAMUSCULAR | Status: DC
  Administered 2015-03-24 – 2015-03-25 (×2): 3 mL via INTRAVENOUS

## 2015-03-24 MED ORDER — ACETAMINOPHEN 650 MG RE SUPP: 650.0000 mg | RECTAL | Status: DC | PRN

## 2015-03-24 MED ORDER — HYDROCHLOROTHIAZIDE 25 MG PO TABS
25.0000 mg | ORAL_TABLET | Freq: Every day | ORAL | Status: DC
  Administered 2015-03-25 – 2015-03-27 (×3): 25 mg via ORAL
  Filled 2015-03-24 (×3): qty 1

## 2015-03-24 MED ORDER — SUGAMMADEX SODIUM 200 MG/2ML IV SOLN
INTRAVENOUS | Status: DC | PRN
  Administered 2015-03-24: 200 mg via INTRAVENOUS

## 2015-03-24 MED ORDER — ONDANSETRON HCL 4 MG/2ML IJ SOLN
4.0000 mg | INTRAMUSCULAR | Status: DC | PRN
  Administered 2015-03-24 – 2015-03-25 (×2): 4 mg via INTRAVENOUS
  Filled 2015-03-24 (×2): qty 2

## 2015-03-24 MED ORDER — LORAZEPAM 0.5 MG PO TABS
0.5000 mg | ORAL_TABLET | Freq: Every evening | ORAL | Status: DC | PRN
  Administered 2015-03-25: 0.5 mg via ORAL
  Filled 2015-03-24 (×2): qty 1

## 2015-03-24 MED ORDER — HYDROMORPHONE HCL 1 MG/ML IJ SOLN
INTRAMUSCULAR | Status: AC
  Filled 2015-03-24: qty 1

## 2015-03-24 MED ORDER — MAGNESIUM CITRATE PO SOLN: 1.0000 | Freq: Once | ORAL | Status: DC | PRN

## 2015-03-24 MED ORDER — VITAMIN D 1000 UNITS PO TABS
1000.0000 [IU] | ORAL_TABLET | Freq: Every day | ORAL | Status: DC
  Administered 2015-03-25 – 2015-03-27 (×3): 1000 [IU] via ORAL
  Filled 2015-03-24 (×3): qty 1

## 2015-03-24 MED ORDER — SODIUM CHLORIDE 0.9 % IV SOLN: 250.0000 mL | INTRAVENOUS | Status: DC

## 2015-03-24 MED ORDER — TRIAMCINOLONE ACETONIDE 0.1 % EX CREA: 1.0000 "application " | TOPICAL_CREAM | Freq: Two times a day (BID) | CUTANEOUS | Status: DC | PRN

## 2015-03-24 MED ORDER — PROPOFOL 10 MG/ML IV BOLUS
INTRAVENOUS | Status: DC | PRN
  Administered 2015-03-24: 150 mg via INTRAVENOUS
  Administered 2015-03-24: 50 mg via INTRAVENOUS

## 2015-03-24 MED ORDER — EPHEDRINE SULFATE 50 MG/ML IJ SOLN
INTRAMUSCULAR | Status: DC | PRN
  Administered 2015-03-24 (×2): 15 mg via INTRAVENOUS

## 2015-03-24 MED ORDER — THROMBIN 20000 UNITS EX SOLR
CUTANEOUS | Status: DC | PRN
  Administered 2015-03-24: 10:00:00 via TOPICAL

## 2015-03-24 MED ORDER — ONDANSETRON HCL 4 MG/2ML IJ SOLN
INTRAMUSCULAR | Status: DC | PRN
  Administered 2015-03-24: 4 mg via INTRAVENOUS

## 2015-03-24 MED ORDER — SUGAMMADEX SODIUM 200 MG/2ML IV SOLN
INTRAVENOUS | Status: AC
  Filled 2015-03-24: qty 2

## 2015-03-24 MED ORDER — BENAZEPRIL-HYDROCHLOROTHIAZIDE 20-25 MG PO TABS: 1.0000 | ORAL_TABLET | Freq: Every day | ORAL | Status: DC

## 2015-03-24 MED ORDER — AMITRIPTYLINE HCL 25 MG PO TABS: 25.0000 mg | ORAL_TABLET | Freq: Every evening | ORAL | Status: DC | PRN

## 2015-03-24 MED ORDER — DEXTROSE 5 % IV SOLN
10.0000 mg | INTRAVENOUS | Status: DC | PRN
  Administered 2015-03-24: 25 ug/min via INTRAVENOUS

## 2015-03-24 MED ORDER — 0.9 % SODIUM CHLORIDE (POUR BTL) OPTIME
TOPICAL | Status: DC | PRN
  Administered 2015-03-24: 1000 mL

## 2015-03-24 MED ORDER — ROCURONIUM BROMIDE 100 MG/10ML IV SOLN
INTRAVENOUS | Status: DC | PRN
  Administered 2015-03-24: 30 mg via INTRAVENOUS
  Administered 2015-03-24: 10 mg via INTRAVENOUS
  Administered 2015-03-24: 50 mg via INTRAVENOUS

## 2015-03-24 MED ORDER — SUCCINYLCHOLINE CHLORIDE 20 MG/ML IJ SOLN
INTRAMUSCULAR | Status: AC
  Filled 2015-03-24: qty 1

## 2015-03-24 MED ORDER — SIMVASTATIN 40 MG PO TABS
40.0000 mg | ORAL_TABLET | Freq: Every evening | ORAL | Status: DC
  Administered 2015-03-25 – 2015-03-26 (×2): 40 mg via ORAL
  Filled 2015-03-24 (×2): qty 1

## 2015-03-24 MED ORDER — ONDANSETRON HCL 4 MG/2ML IJ SOLN
INTRAMUSCULAR | Status: AC
  Filled 2015-03-24: qty 2

## 2015-03-24 MED ORDER — KETOROLAC TROMETHAMINE 30 MG/ML IJ SOLN
30.0000 mg | Freq: Once | INTRAMUSCULAR | Status: AC
  Administered 2015-03-24: 30 mg via INTRAVENOUS

## 2015-03-24 MED ORDER — LIDOCAINE HCL (CARDIAC) 20 MG/ML IV SOLN
INTRAVENOUS | Status: DC | PRN
Start: 2015-03-24 — End: 2015-03-24
  Administered 2015-03-24: 60 mg via INTRAVENOUS

## 2015-03-24 MED ORDER — CEFAZOLIN SODIUM-DEXTROSE 2-3 GM-% IV SOLR: 2.0000 g | INTRAVENOUS | Status: DC

## 2015-03-24 MED ORDER — PHENOL 1.4 % MT LIQD
1.0000 | OROMUCOSAL | Status: DC | PRN
  Filled 2015-03-24: qty 177

## 2015-03-24 MED ORDER — BISACODYL 5 MG PO TBEC: 5.0000 mg | DELAYED_RELEASE_TABLET | Freq: Every day | ORAL | Status: DC | PRN

## 2015-03-24 MED ORDER — ACETAMINOPHEN 325 MG PO TABS
650.0000 mg | ORAL_TABLET | ORAL | Status: DC | PRN
  Administered 2015-03-25 (×2): 650 mg via ORAL
  Filled 2015-03-24 (×2): qty 2

## 2015-03-24 MED ORDER — DIAZEPAM 5 MG PO TABS
5.0000 mg | ORAL_TABLET | Freq: Four times a day (QID) | ORAL | Status: DC | PRN
  Administered 2015-03-25 – 2015-03-26 (×2): 5 mg via ORAL
  Filled 2015-03-24 (×2): qty 1

## 2015-03-24 MED ORDER — ZOLPIDEM TARTRATE 5 MG PO TABS
5.0000 mg | ORAL_TABLET | Freq: Every evening | ORAL | Status: DC | PRN
  Administered 2015-03-25: 5 mg via ORAL
  Filled 2015-03-24: qty 1

## 2015-03-24 MED ORDER — SODIUM CHLORIDE 0.9 % IJ SOLN: 3.0000 mL | INTRAMUSCULAR | Status: DC | PRN

## 2015-03-24 MED ORDER — PROMETHAZINE HCL 25 MG/ML IJ SOLN
INTRAMUSCULAR | Status: AC
  Filled 2015-03-24: qty 1

## 2015-03-24 MED ORDER — LACTATED RINGERS IV SOLN
INTRAVENOUS | Status: DC | PRN
  Administered 2015-03-24 (×3): via INTRAVENOUS

## 2015-03-24 MED ORDER — HYDROMORPHONE HCL 1 MG/ML IJ SOLN
0.2500 mg | INTRAMUSCULAR | Status: DC | PRN
  Administered 2015-03-24 (×2): 0.5 mg via INTRAVENOUS

## 2015-03-24 MED ORDER — HYDROCODONE-ACETAMINOPHEN 7.5-325 MG PO TABS: 1.0000 | ORAL_TABLET | Freq: Once | ORAL | Status: DC | PRN

## 2015-03-24 MED ORDER — LIDOCAINE HCL (CARDIAC) 20 MG/ML IV SOLN
INTRAVENOUS | Status: AC
  Filled 2015-03-24: qty 5

## 2015-03-24 MED ORDER — MENTHOL 3 MG MT LOZG: 1.0000 | LOZENGE | OROMUCOSAL | Status: DC | PRN

## 2015-03-24 MED ORDER — EPHEDRINE SULFATE 50 MG/ML IJ SOLN
INTRAMUSCULAR | Status: AC
  Filled 2015-03-24: qty 1

## 2015-03-24 MED ORDER — BENAZEPRIL HCL 20 MG PO TABS
20.0000 mg | ORAL_TABLET | Freq: Every day | ORAL | Status: DC
  Administered 2015-03-25 – 2015-03-27 (×3): 20 mg via ORAL
  Filled 2015-03-24 (×3): qty 1

## 2015-03-24 MED ORDER — OXYCODONE-ACETAMINOPHEN 5-325 MG PO TABS
1.0000 | ORAL_TABLET | ORAL | Status: DC | PRN
  Administered 2015-03-25 (×2): 2 via ORAL
  Administered 2015-03-25: 1 via ORAL
  Administered 2015-03-26 – 2015-03-27 (×4): 2 via ORAL
  Filled 2015-03-24: qty 2
  Filled 2015-03-24: qty 1
  Filled 2015-03-24 (×5): qty 2

## 2015-03-24 MED ORDER — PROPOFOL 10 MG/ML IV BOLUS
INTRAVENOUS | Status: AC
  Filled 2015-03-24: qty 20

## 2015-03-24 MED ORDER — PHENYLEPHRINE 40 MCG/ML (10ML) SYRINGE FOR IV PUSH (FOR BLOOD PRESSURE SUPPORT)
PREFILLED_SYRINGE | INTRAVENOUS | Status: AC
  Filled 2015-03-24: qty 10

## 2015-03-24 MED ORDER — FENTANYL CITRATE (PF) 100 MCG/2ML IJ SOLN
INTRAMUSCULAR | Status: DC | PRN
  Administered 2015-03-24: 25 ug via INTRAVENOUS
  Administered 2015-03-24: 150 ug via INTRAVENOUS
  Administered 2015-03-24: 25 ug via INTRAVENOUS
  Administered 2015-03-24: 50 ug via INTRAVENOUS
  Administered 2015-03-24: 100 ug via INTRAVENOUS
  Administered 2015-03-24: 50 ug via INTRAVENOUS

## 2015-03-24 MED ORDER — SENNOSIDES-DOCUSATE SODIUM 8.6-50 MG PO TABS: 1.0000 | ORAL_TABLET | Freq: Every evening | ORAL | Status: DC | PRN

## 2015-03-24 MED ORDER — KETOROLAC TROMETHAMINE 30 MG/ML IJ SOLN
INTRAMUSCULAR | Status: AC
  Filled 2015-03-24: qty 1

## 2015-03-24 MED ORDER — PROMETHAZINE HCL 25 MG/ML IJ SOLN
6.2500 mg | INTRAMUSCULAR | Status: DC | PRN
  Administered 2015-03-24: 6.25 mg via INTRAVENOUS

## 2015-03-24 SURGICAL SUPPLY — 61 items
BENZOIN TINCTURE PRP APPL 2/3 (GAUZE/BANDAGES/DRESSINGS) IMPLANT
BLADE CLIPPER SURG (BLADE) IMPLANT
BUR MATCHSTICK NEURO 3.0 LAGG (BURR) ×3 IMPLANT
BUR PRECISION FLUTE 5.0 (BURR) ×3 IMPLANT
CANISTER SUCT 3000ML PPV (MISCELLANEOUS) ×3 IMPLANT
CLOSURE WOUND 1/2 X4 (GAUZE/BANDAGES/DRESSINGS)
CONNECTOR 35MM (Connector) ×3 IMPLANT
CONNECTOR REVISION OPEN/CLOSED (Connector) ×6 IMPLANT
CONT SPEC 4OZ CLIKSEAL STRL BL (MISCELLANEOUS) ×3 IMPLANT
COVER BACK TABLE 60X90IN (DRAPES) ×3 IMPLANT
DECANTER SPIKE VIAL GLASS SM (MISCELLANEOUS) ×3 IMPLANT
DRAPE C-ARM 42X72 X-RAY (DRAPES) ×6 IMPLANT
DRAPE C-ARMOR (DRAPES) ×3 IMPLANT
DRAPE LAPAROTOMY 100X72X124 (DRAPES) ×3 IMPLANT
DRAPE POUCH INSTRU U-SHP 10X18 (DRAPES) ×3 IMPLANT
DRAPE SURG 17X23 STRL (DRAPES) ×3 IMPLANT
DRSG OPSITE POSTOP 4X8 (GAUZE/BANDAGES/DRESSINGS) ×3 IMPLANT
DURAPREP 26ML APPLICATOR (WOUND CARE) ×3 IMPLANT
ELECT REM PT RETURN 9FT ADLT (ELECTROSURGICAL) ×3
ELECTRODE REM PT RTRN 9FT ADLT (ELECTROSURGICAL) ×1 IMPLANT
GAUZE SPONGE 4X4 12PLY STRL (GAUZE/BANDAGES/DRESSINGS) IMPLANT
GAUZE SPONGE 4X4 16PLY XRAY LF (GAUZE/BANDAGES/DRESSINGS) ×3 IMPLANT
GLOVE ECLIPSE 6.5 STRL STRAW (GLOVE) ×6 IMPLANT
GLOVE EXAM NITRILE LRG STRL (GLOVE) IMPLANT
GLOVE EXAM NITRILE MD LF STRL (GLOVE) IMPLANT
GLOVE EXAM NITRILE XL STR (GLOVE) IMPLANT
GLOVE EXAM NITRILE XS STR PU (GLOVE) IMPLANT
GOWN STRL REUS W/ TWL LRG LVL3 (GOWN DISPOSABLE) ×2 IMPLANT
GOWN STRL REUS W/ TWL XL LVL3 (GOWN DISPOSABLE) IMPLANT
GOWN STRL REUS W/TWL 2XL LVL3 (GOWN DISPOSABLE) IMPLANT
GOWN STRL REUS W/TWL LRG LVL3 (GOWN DISPOSABLE) ×4
GOWN STRL REUS W/TWL XL LVL3 (GOWN DISPOSABLE)
KIT BASIN OR (CUSTOM PROCEDURE TRAY) ×3 IMPLANT
KIT POSITION SURG JACKSON T1 (MISCELLANEOUS) ×3 IMPLANT
KIT ROOM TURNOVER OR (KITS) ×3 IMPLANT
LIQUID BAND (GAUZE/BANDAGES/DRESSINGS) ×3 IMPLANT
NEEDLE HYPO 25X1 1.5 SAFETY (NEEDLE) ×3 IMPLANT
NEEDLE SPNL 18GX3.5 QUINCKE PK (NEEDLE) ×3 IMPLANT
NS IRRIG 1000ML POUR BTL (IV SOLUTION) ×3 IMPLANT
PACK LAMINECTOMY NEURO (CUSTOM PROCEDURE TRAY) ×3 IMPLANT
PAD ARMBOARD 7.5X6 YLW CONV (MISCELLANEOUS) ×6 IMPLANT
ROD 50MM (Rod) ×4 IMPLANT
ROD SPNL CVD 50X4.75X (Rod) ×2 IMPLANT
SCREW CONNECTOR SET (Screw) ×15 IMPLANT
SCREW MAS 6.5X45 (Screw) ×6 IMPLANT
SCREW SET SOLERA (Screw) ×4 IMPLANT
SCREW SET SOLERA TI (Screw) ×2 IMPLANT
SPACER OPAL 10X24 (Orthopedic Implant) ×6 IMPLANT
SPONGE LAP 4X18 X RAY DECT (DISPOSABLE) ×6 IMPLANT
SPONGE SURGIFOAM ABS GEL 100 (HEMOSTASIS) ×3 IMPLANT
STRIP CLOSURE SKIN 1/2X4 (GAUZE/BANDAGES/DRESSINGS) IMPLANT
SUT PROLENE 6 0 BV (SUTURE) IMPLANT
SUT VIC AB 0 CT1 18XCR BRD8 (SUTURE) ×2 IMPLANT
SUT VIC AB 0 CT1 8-18 (SUTURE) ×4
SUT VIC AB 2-0 CT1 18 (SUTURE) ×3 IMPLANT
SUT VIC AB 3-0 SH 8-18 (SUTURE) ×12 IMPLANT
SYR CONTROL 10ML LL (SYRINGE) ×3 IMPLANT
TOWEL OR 17X24 6PK STRL BLUE (TOWEL DISPOSABLE) ×3 IMPLANT
TOWEL OR 17X26 10 PK STRL BLUE (TOWEL DISPOSABLE) ×3 IMPLANT
TRAY FOLEY W/METER SILVER 14FR (SET/KITS/TRAYS/PACK) ×3 IMPLANT
WATER STERILE IRR 1000ML POUR (IV SOLUTION) ×3 IMPLANT

## 2015-03-24 NOTE — Op Note (Signed)
03/24/2015  2:08 PM  PATIENT:  Brittany DingsMary Poole  69 y.o. female with neurogenic claudication, and severe stenosis at L2/3, adjacent to previous L3-5 fusion.conservative measures having failed she has opted for operative decompression and fixation.  PRE-OPERATIVE DIAGNOSIS:  Spondylolisthesis L2/3, lumbar stenosis L2/3  POST-OPERATIVE DIAGNOSIS:  Spondylolisthesis L2/3, Lumbar stenosis L2/3  PROCEDURE:  Procedure(s): Lumbar two- Lumbar three decompression beyond the anatomic need for a PLIF  posterior lumbar interbody fusion L2/3 with interbody x2 Peek prostheses(synthes)4211mmx22mm, morselized autograft   posterior nonsegmental instrumentation L 2/3( Medtronic Solera), with connectors to existing rods  SURGEON:  Surgeon(s): Coletta MemosKyle Laurren Lepkowski, MD Shirlean Kellyobert Nudelman, MD  ASSISTANTS:Nudelman, Molly Madurorobert  ANESTHESIA:   general  EBL:  Total I/O In: 2000 [I.V.:2000] Out: 800 [Urine:400; Blood:400]  BLOOD ADMINISTERED:none  CELL SAVER GIVEN:none  COUNT:per nursing  DRAINS: none   SPECIMEN:  No Specimen  DICTATION: Brittany DingsMary Poole is a 69 y.o. female whom was taken to the operating room intubated, and placed under a general anesthetic without difficulty. A foley catheter was placed under sterile conditions. She was positioned prone on a Jackson stable with all pressure points properly padded.  Her lumbar region was prepped and draped in a sterile manner. I infiltrated 0cc's 1/2%lidocaine/1:2000,000 strength epinephrine into the planned incision. I opened the skin with a 10 blade and took the incision down to the thoracolumbar fascia. I exposed the lamina of L2,and L3 in a subperiosteal fashion bilaterally. I dissected the cross connector and the L3,and L4 screws bilaterally.  I confirmed my location with an intraoperative xray.  I placed self retaining retractors and started the decompression.  I decompressed the spinal canal and the lateral recesses of L2/3 along with Dr. Newell CoralNudelman. We used the drill and  the Kerrison punches. WE decompressed the L2, and the L3 roots bilaterally. The facets were quite arthritic and enlarged. The ligamentum flavum was very hard and hypertrophied and the major component of the stenosis. The L2 inferior facets were removed in order to decompress the canal and the nerve roots. I did expose the L2/3 disc space on each side in anticipation of the PLIF. PLIF's were performed at L2/3 in the same fashion. I opened the disc space with a 15 blade then used a variety of instruments to remove the disc and prepare the space for the arthrodesis. I used curettes, rongeurs, punches, shavers for the disc space, and rasps in the discetomy. I measured the disc space and placed 11mm  Peek cages(Synthes) into the disc space(s), with local morsels of autograft.  I placed pedicle screws at L2, using fluoroscopic guidance. I drilled a pilot hole, then cannulated the pedicle with a bone probe at each site. I then tapped each pedicle, assessing each site for pedicle violations. No cutouts were appreciated. Screws 6.5 x545mm (medtronic solera) were then placed at each site without difficulty. Final films were performed and all screws appeared to be in good position. I used offset connectors to make use of the existing rod. I removed the cross connector and used the space on the rod to place offset connectors. On the right side I was able to thread a rod through the screw head and the connector. On the left side I used two offsets to connect to the rod on the left. I closed the wound in a layered fashion. I approximated the thoracolumbar fascia, subcutaneous, and subcuticular planes with vicryl sutures. I used dermabond and an occlusive bandage for a sterile dressing.     PLAN OF CARE: Admit to  inpatient   PATIENT DISPOSITION:  PACU - hemodynamically stable.   Delay start of Pharmacological VTE agent (>24hrs) due to surgical blood loss or risk of bleeding:  yes

## 2015-03-24 NOTE — Transfer of Care (Signed)
Immediate Anesthesia Transfer of Care Note  Patient: Brittany Poole  Procedure(s) Performed: Procedure(s) with comments: Lumbar two- Lumbar three decompression with posterior lumbar interbody fusion with interbody prosthesis posterior lateral arthrodesis and posterior nonsegmental instrumentation (N/A) - L23 decompression with posterior lumbar interbody fusion with interbody prosthesis posterior lateral arthrodesis and posterior nonsegmental instrumentation  Patient Location: PACU  Anesthesia Type:General  Level of Consciousness: awake, alert , oriented and patient cooperative  Airway & Oxygen Therapy: Patient Spontanous Breathing and Patient connected to nasal cannula oxygen  Post-op Assessment: Report given to RN, Post -op Vital signs reviewed and stable and Patient moving all extremities X 4  Post vital signs: Reviewed and stable  Last Vitals:  Filed Vitals:   03/24/15 0709  BP: 163/71  Pulse: 60  Temp: 37.4 C  Resp: 20    Complications: No apparent anesthesia complications

## 2015-03-24 NOTE — Progress Notes (Signed)
Utilization review completed.  

## 2015-03-24 NOTE — Anesthesia Preprocedure Evaluation (Addendum)
Anesthesia Evaluation  Patient identified by MRN, date of birth, ID band Patient awake    Reviewed: Allergy & Precautions, NPO status , Patient's Chart, lab work & pertinent test results, reviewed documented beta blocker date and time   Airway Mallampati: III  TM Distance: >3 FB Neck ROM: Full    Dental  (+) Dental Advisory Given   Pulmonary sleep apnea , Current Smoker,    breath sounds clear to auscultation       Cardiovascular hypertension, Pt. on medications and Pt. on home beta blockers + CAD and + Cardiac Stents   Rhythm:Regular Rate:Normal  Negative stress test 01/2014. Nl EF.   Neuro/Psych Anxiety negative neurological ROS     GI/Hepatic Neg liver ROS, GERD  Medicated and Controlled,  Endo/Other  diabetes, Type 2, Oral Hypoglycemic AgentsMorbid obesity  Renal/GU Renal InsufficiencyRenal disease     Musculoskeletal  (+) Arthritis ,   Abdominal   Peds  Hematology negative hematology ROS (+)   Anesthesia Other Findings   Reproductive/Obstetrics                             Component Value Date/Time   WBC 7.9 03/15/2015 1105   RBC 4.43 03/15/2015 1105   HGB 12.9 03/15/2015 1105   HCT 40.5 03/15/2015 1105   PLT 199 03/15/2015 1105      Component Value Date/Time   NA 140 03/15/2015 1105   K 3.6 03/15/2015 1105   CL 102 03/15/2015 1105   CO2 32 03/15/2015 1105   BUN 7 03/15/2015 1105   CREATININE 1.14* 03/15/2015 1105   CALCIUM 10.0 03/15/2015 1105      Anesthesia Physical Anesthesia Plan  ASA: III  Anesthesia Plan: General   Post-op Pain Management:    Induction: Intravenous  Airway Management Planned: Oral ETT  Additional Equipment:   Intra-op Plan:   Post-operative Plan: Extubation in OR  Informed Consent: I have reviewed the patients History and Physical, chart, labs and discussed the procedure including the risks, benefits and alternatives for the proposed  anesthesia with the patient or authorized representative who has indicated his/her understanding and acceptance.   Dental advisory given  Plan Discussed with: CRNA, Anesthesiologist and Surgeon  Anesthesia Plan Comments:       Anesthesia Quick Evaluation

## 2015-03-24 NOTE — H&P (Signed)
  BP 163/71 mmHg  Pulse 60  Temp(Src) 99.3 F (37.4 C) (Oral)  Resp 20  Ht 5\' 9"  (1.753 m)  Wt 112.038 kg (247 lb)  BMI 36.46 kg/m2  SpO2 97% Brittany Poole is a woman who I took to the operating room about three years ago for a lumbar fusion at L3-4 and 4-5. She says she had done better afterwards but her husband passed about a year ago and she has been having increasing pain in the lower back bilaterally since then. She says the pain is equal on both sides, not so much in the leg but in the back. If she lies down in the bed at night she finds it very difficult to get any rest or relief of the pain. She does better sitting.  Allergies  Allergen Reactions  . Codeine Nausea And Vomiting   Past Medical History  Diagnosis Date  . Diabetes mellitus   . Arthritis   . Sleep apnea     no cpap        last study 15 yrs ago  . Hypertension      [facility]    [place]  . Anxiety   . GERD (gastroesophageal reflux disease)    Past Surgical History  Procedure Laterality Date  . Cardiac catheterization      x4 stents  . Hernia repair    . Back surgery    . Vertebral corpectomy    . Cyst removal neck      left   History reviewed. No pertinent family history. Social History   Social History  . Marital Status: Widowed    Spouse Name: N/A  . Number of Children: N/A  . Years of Education: N/A   Occupational History  . Not on file.   Social History Main Topics  . Smoking status: Current Every Day Smoker -- 1.00 packs/day for 30 years    Types: Cigarettes  . Smokeless tobacco: Not on file  . Alcohol Use: No  . Drug Use: No  . Sexual Activity: Not on file   Other Topics Concern  . Not on file   Social History Narrative    DATA: Plain x-ray shows no change in hardware. I am not sure it shows a solid arthrodesis, but she has no break. Nothing is different. She is collapsing slightly on the right side at the 2-3 disk space and that may be causing some of her discomfort, but I am  not sure.  PHYSICAL EXAMINATION: On examination today she is alert, oriented x4 and answering all questions appropriately. She walks with a cane, slightly tilted towards the right side. She has 5/5 strength in the upper and lower extremities, 2+ reflexes at the knees and ankles, biceps, triceps, brachioradialis. Proprioception intact in the feet bilaterally. Downgoing toes to plantar stimulation. No clonus. Romberg is negative. Tandem walking is performed with ease. She is able to toe walk and heel walk.  Brittany Poole returned today to look at the MRI of the lumbar spine. What it shows is severe stenosis at the level above our arthrodesis at L2-3. It is profound. It is the reason why she has the pain.  IMPRESSION/PLAN: She wants to proceed with the operation,

## 2015-03-24 NOTE — Anesthesia Procedure Notes (Signed)
Procedure Name: Intubation Date/Time: 03/24/2015 8:59 AM Performed by: Rogelia BogaMUELLER, Sukhmani Fetherolf P Pre-anesthesia Checklist: Patient identified, Emergency Drugs available, Suction available, Patient being monitored and Timeout performed Patient Re-evaluated:Patient Re-evaluated prior to inductionOxygen Delivery Method: Circle system utilized Preoxygenation: Pre-oxygenation with 100% oxygen Intubation Type: IV induction Ventilation: Mask ventilation without difficulty and Oral airway inserted - appropriate to patient size Laryngoscope Size: Mac and 4 Grade View: Grade II Tube type: Oral Tube size: 7.5 mm Number of attempts: 1 Airway Equipment and Method: Stylet Placement Confirmation: ETT inserted through vocal cords under direct vision,  positive ETCO2 and breath sounds checked- equal and bilateral Secured at: 22 cm Tube secured with: Tape Dental Injury: Teeth and Oropharynx as per pre-operative assessment

## 2015-03-25 LAB — GLUCOSE, CAPILLARY
GLUCOSE-CAPILLARY: 124 mg/dL — AB (ref 65–99)
GLUCOSE-CAPILLARY: 129 mg/dL — AB (ref 65–99)
Glucose-Capillary: 106 mg/dL — ABNORMAL HIGH (ref 65–99)
Glucose-Capillary: 135 mg/dL — ABNORMAL HIGH (ref 65–99)

## 2015-03-25 MED ORDER — INSULIN ASPART 100 UNIT/ML ~~LOC~~ SOLN
0.0000 [IU] | Freq: Three times a day (TID) | SUBCUTANEOUS | Status: DC
  Administered 2015-03-26 – 2015-03-27 (×2): 3 [IU] via SUBCUTANEOUS

## 2015-03-25 NOTE — Evaluation (Signed)
Physical Therapy Evaluation Patient Details Name: Brittany Poole MRN: [id number] DOB: 05/30/1945 Today's Date: 03/25/2015   History of Present Illness  s/p L2-3 decompression and fusion  PMHx- L4-5 fusion, DM  Clinical Impression  Patient is s/p above surgery resulting in the deficits listed below (see PT Problem List). Patient mobilizing very well, however did not recall back precautions or safe use of RW from prior surgery; requiring continued education prior to d/c. Patient will benefit from skilled PT to increase their independence and safety with mobility (while adhering to their precautions) to allow discharge to the venue listed below.     Follow Up Recommendations No PT follow up    Equipment Recommendations  None recommended by PT    Recommendations for Other Services OT consult     Precautions / Restrictions Precautions Precautions: Back Precaution Booklet Issued: Yes (comment) Precaution Comments: pt unable to state initially; by end of session 3/3 Required Braces or Orthoses:  (no brace)      Mobility  Bed Mobility Overal bed mobility: Needs Assistance Bed Mobility: Rolling;Sidelying to Sit Rolling: Supervision Sidelying to sit: Min guard       General bed mobility comments: vc for technique to maintain back precautions; +rail  Transfers Overall transfer level: Needs assistance Equipment used: Rolling walker (2 wheeled) Transfers: Sit to/from Stand Sit to Stand: Min guard         General transfer comment: vc for safe use of DME  Ambulation/Gait Ambulation/Gait assistance: Min guard Ambulation Distance (Feet): 70 Feet Assistive device: Rolling walker (2 wheeled) Gait Pattern/deviations: Step-through pattern   Gait velocity interpretation: Below normal speed for age/gender    Stairs            Wheelchair Mobility    Modified Rankin (Stroke Patients Only)       Balance Overall balance assessment: No apparent balance deficits (not  formally assessed)                                           Pertinent Vitals/Pain Pain Assessment: 0-10 Pain Score: 4  Pain Location: back Pain Descriptors / Indicators: Operative site guarding Pain Intervention(s): Limited activity within patient's tolerance;Monitored during session;Premedicated before session;Repositioned    Home Living Family/patient expects to be discharged to:: Private residence Living Arrangements: Other relatives (2 grown Pension scheme managergrandaughters) Available Help at Discharge: Family;Available 24 hours/day Type of Home: House Home Access: Level entry     Home Layout: One level Home Equipment: Walker - 2 wheels;Cane - single point;Bedside commode;Adaptive equipment      Prior Function Level of Independence: Independent with assistive device(s)         Comments: used RW vs cane PTA due to pain, however managed all ADLs/IADLs and mobility     Hand Dominance        Extremity/Trunk Assessment   Upper Extremity Assessment: Defer to OT evaluation;Overall WFL for tasks assessed           Lower Extremity Assessment: Overall WFL for tasks assessed (h/o bil neuropathy with no changes)      Cervical / Trunk Assessment: Normal  Communication   Communication: No difficulties  Cognition Arousal/Alertness: Awake/alert Behavior During Therapy: WFL for tasks assessed/performed Overall Cognitive Status: Within Functional Limits for tasks assessed                      General Comments General comments (  skin integrity, edema, etc.): Daughter is an Charity fundraiser and present    Exercises        Assessment/Plan    PT Assessment Patient needs continued PT services  PT Diagnosis Difficulty walking;Acute pain   PT Problem List Decreased mobility;Decreased knowledge of use of DME;Decreased knowledge of precautions  PT Treatment Interventions DME instruction;Gait training;Functional mobility training;Therapeutic activities;Patient/family education    PT Goals (Current goals can be found in the Care Plan section) Acute Rehab PT Goals Patient Stated Goal: go home tomorrow PT Goal Formulation: With patient Time For Goal Achievement: 03/29/15 Potential to Achieve Goals: Good    Frequency Min 5X/week   Barriers to discharge        Co-evaluation               End of Session Equipment Utilized During Treatment: Gait belt Activity Tolerance: Patient tolerated treatment well Patient left: in chair;with call bell/phone within reach;with family/visitor present Nurse Communication: Mobility status         Time: 4098-1191 PT Time Calculation (min) (ACUTE ONLY): 21 min   Charges:   PT Evaluation $Initial PT Evaluation Tier I: 1 Procedure     PT G Codes:        Cornelious Bartolucci 20-Apr-2015, 9:17 AM Pager [phone]

## 2015-03-25 NOTE — Progress Notes (Signed)
Pt foley out, pt sitting up in chair and tolerated it well.  Honeycomb dressing was saturated this am with blood, ABD reinforced over honeycomb.  MD notified, awaiting new orders.  Report given to oncoming nurse.    Estanislado EmmsAshley Schwarz, RN

## 2015-03-25 NOTE — Progress Notes (Signed)
Patient ID: Brittany Poole, female   DOB: 07/02/1945, 69 y.o.   MRN: [id number] BP 125/69 mmHg  Pulse 73  Temp(Src) 98.4 F (36.9 C) (Oral)  Resp 18  Ht 5\' 9"  (1.753 m)  Wt 121.836 kg (268 lb 9.6 oz)  BMI 39.65 kg/m2  SpO2 99% In a lot of pain today. To be expected.  Dressing changed Moving all extremities Continue with current pain regimen.

## 2015-03-25 NOTE — Progress Notes (Signed)
RN encouraged pt to get up and ambulate and the importance of ambulation. Pt refused stating "I need to sleep".  Will try again later.    Estanislado EmmsAshley Schwarz, RN

## 2015-03-25 NOTE — Progress Notes (Signed)
OT Cancellation Note  Patient Details Name: Brittany DingsMary Ferencz MRN: [id number] DOB: 10/07/1945   Cancelled Treatment:    Reason Eval/Treat Not Completed: Pain limiting ability to participate - pt with severe back pain and anxiety (2nd attempt today). RN notified. Will attempt to see tomorrow.  Nils PyleJulia Sean Malinowski, OTR/L 03/25/2015, 3:22 PM

## 2015-03-25 NOTE — Care Management Note (Signed)
Case Management Note  Patient Details  Name: Brittany DingsMary Poole MRN: [id number] Date of Birth: 05/23/1945  Subjective/Objective:  Patient admitted for L2-3 PLIF. Patient is from home with family.                 Action/Plan: Awaiting PT/OT recommendations. CM will continue to follow for discharge needs.  Expected Discharge Date:                  Expected Discharge Plan:     In-House Referral:     Discharge planning Services     Post Acute Care Choice:    Choice offered to:     DME Arranged:    DME Agency:     HH Arranged:    HH Agency:     Status of Service:  In process, will continue to follow  Medicare Important Message Given:    Date Medicare IM Given:    Medicare IM give by:    Date Additional Medicare IM Given:    Additional Medicare Important Message give by:     If discussed at Long Length of Stay Meetings, dates discussed:    Additional Comments:  Kermit BaloKelli F Anjani Feuerborn, RN 03/25/2015, 11:39 AM

## 2015-03-25 NOTE — Progress Notes (Signed)
OT Cancellation Note  Patient Details Name: Brittany Poole MRN: [id number] DOB: 07/31/1945   Cancelled Treatment:    Reason Eval/Treat Not Completed: Patient declined, attempting to get some sleep. Will attempt to see for evaluation later today if schedule allows.   [name], OTR/L 03/25/2015, 1:20 PM

## 2015-03-26 LAB — GLUCOSE, CAPILLARY
GLUCOSE-CAPILLARY: 113 mg/dL — AB (ref 65–99)
GLUCOSE-CAPILLARY: 150 mg/dL — AB (ref 65–99)
Glucose-Capillary: 123 mg/dL — ABNORMAL HIGH (ref 65–99)
Glucose-Capillary: 136 mg/dL — ABNORMAL HIGH (ref 65–99)

## 2015-03-26 MED ORDER — OXYCODONE-ACETAMINOPHEN 5-325 MG PO TABS: 1.0000 | ORAL_TABLET | Freq: Four times a day (QID) | ORAL | Status: AC | PRN

## 2015-03-26 MED ORDER — TIZANIDINE HCL 4 MG PO TABS: 4.0000 mg | ORAL_TABLET | Freq: Four times a day (QID) | ORAL | Status: AC | PRN

## 2015-03-26 NOTE — Discharge Instructions (Signed)

## 2015-03-26 NOTE — Progress Notes (Signed)
Patient ID: Brittany Poole, female   DOB: 08/21/1945, 69 y.o.   MRN: [id number] BP 111/66 mmHg  Pulse 72  Temp(Src) 98.8 F (37.1 C) (Oral)  Resp 18  Ht 5\' 9"  (1.753 m)  Wt 121.836 kg (268 lb 9.6 oz)  BMI 39.65 kg/m2  SpO2 93% Alert and oriented x 4 , speech is clear and fluent Moving lower extremities well Wound is dry, dressing is blood stained Moving better Possible discharge tomorrow. Has received prescriptions.

## 2015-03-26 NOTE — Care Management Important Message (Signed)
Important Message  Patient Details  Name: Brittany Poole MRN: [id number] Date of Birth: 01/10/1946   Medicare Important Message Given:  Yes    [name] P [address] 03/26/2015, 2:28 PM

## 2015-03-26 NOTE — Evaluation (Signed)
Occupational Therapy Evaluation Patient Details Name: Brittany Poole MRN: [id number] DOB: 16-Dec-1945 Today's Date: 03/26/2015    History of Present Illness s/p L2-3 decompression and fusion  PMHx- L4-5 fusion, DM   Clinical Impression   Pt reports she was independent with use of AE for ADLs PTA. Currently pt is overall min guard for functional mobility and ADLs with the exception of min assist for LB ADLs. Pt able to recall 3/3 back precautions at start of session. Pt demonstrated good log roll technique for bed mobility and was able to maintain precautions throughout functional activity. All education complete; pt with no further questions or concerns for OT at this time. Pt planning to d/c home with 24/7 supervision from family. Pt ready to d/c from OT standpoint; signing off at this time. Thank you for this referral.     Follow Up Recommendations  No OT follow up;Supervision - Intermittent    Equipment Recommendations  None recommended by OT    Recommendations for Other Services       Precautions / Restrictions Precautions Precautions: Back Precaution Comments: Pt able to recall 3/3 back precautions at start of session Required Braces or Orthoses:  (no brace) Restrictions Weight Bearing Restrictions: No      Mobility Bed Mobility Overal bed mobility: Needs Assistance Bed Mobility: Rolling;Sidelying to Sit Rolling: Supervision Sidelying to sit: Min guard       General bed mobility comments: Good log roll technique. Use of bed rails  Transfers Overall transfer level: Needs assistance Equipment used: Rolling walker (2 wheeled) Transfers: Sit to/from Stand Sit to Stand: Min guard         General transfer comment: Min guard for safety. Good hand placement and technique. Sit to stand from EOB x 1, toilet x 1    Balance Overall balance assessment: No apparent balance deficits (not formally assessed)                                          ADL  Overall ADL's : Needs assistance/impaired Eating/Feeding: Set up;Sitting   Grooming: Min guard;Wash/dry hands;Standing   Upper Body Bathing: Min guard;Sitting   Lower Body Bathing: Minimal assistance;Sit to/from stand   Upper Body Dressing : Min guard;Sitting   Lower Body Dressing: Minimal assistance;Sit to/from stand Lower Body Dressing Details (indicate cue type and reason): Pt unable to bring foot up to opposite knee. Pt reports use of reacher at home to assist with LB ADLs.  Toilet Transfer: Min guard;Ambulation;Regular Toilet;RW;Grab bars   Toileting- Clothing Manipulation and Hygiene: Min guard;Sitting/lateral lean;Sit to/from stand Toileting - Architect Details (indicate cue type and reason): Educated pt on maintaining back precautions during toilet hygiene Tub/ Shower Transfer: Min guard;Walk-in shower;Ambulation;Shower seat;Rolling walker   Functional mobility during ADLs: Min guard;Rolling walker General ADL Comments: No family present for OT eval. Educated on need for supervision for ADLs and mobility, compensatory strategies for ADLs, home safety; pt verbalized understanding.     Vision     Perception     Praxis      Pertinent Vitals/Pain Pain Assessment: Faces Faces Pain Scale: Hurts little more Pain Location: back Pain Descriptors / Indicators: Sore Pain Intervention(s): Limited activity within patient's tolerance;Monitored during session;Repositioned     Hand Dominance     Extremity/Trunk Assessment Upper Extremity Assessment Upper Extremity Assessment: Overall WFL for tasks assessed   Lower Extremity Assessment Lower Extremity Assessment: Defer  to PT evaluation   Cervical / Trunk Assessment Cervical / Trunk Assessment: Normal   Communication Communication Communication: No difficulties   Cognition Arousal/Alertness: Awake/alert Behavior During Therapy: WFL for tasks assessed/performed Overall Cognitive Status: Within Functional Limits  for tasks assessed                     General Comments       Exercises       Shoulder Instructions      Home Living Family/patient expects to be discharged to:: Private residence Living Arrangements: Other relatives;Children (2 grown grandaughters) Available Help at Discharge: Family;Available 24 hours/day Type of Home: House Home Access: Level entry     Home Layout: One level     Bathroom Shower/Tub: Producer, television/film/videoWalk-in shower   Bathroom Toilet: Standard Bathroom Accessibility: Yes How Accessible: Accessible via walker Home Equipment: Walker - 2 wheels;Cane - single point;Bedside commode;Adaptive equipment;Shower seat - built in;Grab bars - Probation officertub/shower Adaptive Equipment: Reacher        Prior Functioning/Environment Level of Independence: Independent with assistive device(s)        Comments: used RW vs cane PTA due to pain, however managed all ADLs/IADLs and mobility    OT Diagnosis: Acute pain   OT Problem List:     OT Treatment/Interventions:      OT Goals(Current goals can be found in the care plan section) Acute Rehab OT Goals Patient Stated Goal: to go home OT Goal Formulation: With patient  OT Frequency:     Barriers to D/C:            Co-evaluation              End of Session Equipment Utilized During Treatment: Gait belt;Rolling walker  Activity Tolerance: Patient tolerated treatment well Patient left: in chair;with call bell/phone within reach   Time: 1203-1219 OT Time Calculation (min): 16 min Charges:  OT General Charges $OT Visit: 1 Procedure OT Evaluation $Initial OT Evaluation Tier I: 1 Procedure G-Codes:     Gaye AlkenBailey A Javen Ridings M.S., OTR/L Pager: [phone]  03/26/2015, 1:41 PM

## 2015-03-26 NOTE — Anesthesia Postprocedure Evaluation (Signed)
Anesthesia Post Note  Patient: Brittany Poole  Procedure(s) Performed: Procedure(s) (LRB): Lumbar two- Lumbar three decompression with posterior lumbar interbody fusion with interbody prosthesis posterior lateral arthrodesis and posterior nonsegmental instrumentation (N/A)  Patient location during evaluation: PACU Anesthesia Type: General Level of consciousness: awake and alert Pain management: pain level controlled Vital Signs Assessment: post-procedure vital signs reviewed and stable Respiratory status: spontaneous breathing Cardiovascular status: blood pressure returned to baseline Anesthetic complications: no    Last Vitals:  Filed Vitals:   03/26/15 0415 03/26/15 0629  BP:  120/53  Pulse:  78  Temp: 37.6 C 38.1 C  Resp:  18    Last Pain:  Filed Vitals:   03/26/15 0753  PainSc: 0-No pain                 Kennieth RadFitzgerald, Alainah Phang E

## 2015-03-26 NOTE — Progress Notes (Signed)
Physical Therapy Treatment Patient Details Name: Brittany Poole MRN: [id number] DOB: December 21, 1945 Today's Date: 03/26/2015    History of Present Illness s/p L2-3 decompression and fusion  PMHx- L4-5 fusion, DM    PT Comments    Pt making good progress.   Follow Up Recommendations  No PT follow up     Equipment Recommendations  None recommended by PT    Recommendations for Other Services       Precautions / Restrictions Precautions Precautions: Back Precaution Comments: Recalled 3/3 precautions Required Braces or Orthoses:  (no brace) Restrictions Weight Bearing Restrictions: No    Mobility  Bed Mobility Overal bed mobility: Needs Assistance Bed Mobility: Rolling;Sidelying to Sit;Sit to Sidelying Rolling: Supervision Sidelying to sit: Supervision     Sit to sidelying: Min assist General bed mobility comments: cues for sit > supine  Transfers Overall transfer level: Needs assistance Equipment used: Rolling walker (2 wheeled) Transfers: Sit to/from Stand Sit to Stand: Supervision         General transfer comment: Cues for hand placement with stand >sit  Ambulation/Gait Ambulation/Gait assistance: Min guard Ambulation Distance (Feet): 175 Feet Assistive device: Rolling walker (2 wheeled) Gait Pattern/deviations: Step-through pattern     General Gait Details: Good cadence   Stairs            Wheelchair Mobility    Modified Rankin (Stroke Patients Only)       Balance Overall balance assessment: No apparent balance deficits (not formally assessed)                                  Cognition Arousal/Alertness: Awake/alert Behavior During Therapy: WFL for tasks assessed/performed Overall Cognitive Status: Within Functional Limits for tasks assessed                      Exercises      General Comments General comments (skin integrity, edema, etc.): Pt asking PT to look at back. Bandage intact. Pt feels like things are  crawling on her skin.  She is worried about shingles.  Nursing informed.      Pertinent Vitals/Pain Pain Assessment: 0-10 Pain Score: 4  Faces Pain Scale: Hurts little more Pain Location: back Pain Descriptors / Indicators: Other (Comment) (like things are crawling) Pain Intervention(s): Limited activity within patient's tolerance;Monitored during session    Home Living Family/patient expects to be discharged to:: Private residence Living Arrangements: Other relatives;Children (2 grown grandaughters) Available Help at Discharge: Family;Available 24 hours/day Type of Home: House Home Access: Level entry   Home Layout: One level Home Equipment: Walker - 2 wheels;Cane - single point;Bedside commode;Adaptive equipment;Shower seat - built in;Grab bars - tub/shower      Prior Function Level of Independence: Independent with assistive device(s)      Comments: used RW vs cane PTA due to pain, however managed all ADLs/IADLs and mobility   PT Goals (current goals can now be found in the care plan section) Acute Rehab PT Goals Patient Stated Goal: to go home PT Goal Formulation: With patient Time For Goal Achievement: 03/29/15 Potential to Achieve Goals: Good Progress towards PT goals: Progressing toward goals    Frequency  Min 5X/week    PT Plan Current plan remains appropriate    Co-evaluation             End of Session Equipment Utilized During Treatment: Gait belt Activity Tolerance: Patient tolerated treatment well Patient left:  in bed;with call bell/phone within reach;with family/visitor present     Time: [id number] PT Time Calculation (min) (ACUTE ONLY): 16 min  Charges:  $Gait Training: 8-22 mins                    G Codes:      Donaven Criswell LUBECK 03/26/2015, 2:07 PM

## 2015-03-27 LAB — HEMOGLOBIN A1C
Hgb A1c MFr Bld: 8 % — ABNORMAL HIGH (ref 4.8–5.6)
Mean Plasma Glucose: 183 mg/dL

## 2015-03-27 LAB — GLUCOSE, CAPILLARY: GLUCOSE-CAPILLARY: 135 mg/dL — AB (ref 65–99)

## 2015-03-27 NOTE — Progress Notes (Signed)
Patient ID: Brittany DingsMary Poole, female   DOB: 05/16/1945, 69 y.o.   MRN: [id number] Well no leg pain back pain well-controlled  Neurologically stable  Discharge home

## 2015-03-27 NOTE — Discharge Summary (Signed)
  Physician Discharge Summary  Patient ID: Brittany DingsMary Poole MRN: [id number] DOB/AGE: 68/04/1945 69 y.o.  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  Consults: Significant Diagnostic Studies: Treatments: L2-3 posterior lumbar interbody fusion Discharge Exam: Blood pressure 132/63, pulse 77, temperature 99.9 F (37.7 C), temperature source Oral, resp. rate 20, height 5\' 9"  (1.753 m), weight 121.836 kg (268 lb 9.6 oz), SpO2 90 %. Strength out of 5 wound clean dry and intact  Disposition: Home     Medication List    STOP taking these medications        meloxicam 15 MG tablet  Commonly known as:  MOBIC      TAKE these medications        amitriptyline 25 MG tablet  Commonly known as:  ELAVIL  Take 25 mg by mouth at bedtime as needed. For legs     aspirin 325 MG tablet  Take 325 mg by mouth daily.     atenolol 25 MG tablet  Commonly known as:  TENORMIN  Take 25 mg by mouth daily.     benazepril-hydrochlorthiazide 20-25 MG tablet  Commonly known as:  LOTENSIN HCT  Take 1 tablet by mouth daily.     cholecalciferol 1000 UNITS tablet  Commonly known as:  VITAMIN D  Take 1,000 Units by mouth daily.     LORazepam 1 MG tablet  Commonly known as:  ATIVAN  Take 0.5 mg by mouth at bedtime as needed for anxiety or sleep.     metFORMIN 500 MG tablet  Commonly known as:  GLUCOPHAGE  Take 500 mg by mouth 2 (two) times daily with a meal.     oxyCODONE-acetaminophen 5-325 MG tablet  Commonly known as:  ROXICET  Take 1  tablet by mouth every 6 (six) hours as needed for severe pain.     simvastatin 40 MG tablet  Commonly known as:  ZOCOR  Take 40 mg by mouth every evening.     tiZANidine 4 MG tablet  Commonly known as:  ZANAFLEX  Take 1 tablet (4 mg total) by mouth every 6 (six) hours as needed for muscle spasms.     triamcinolone cream 0.1 %  Commonly known as:  KENALOG  Apply 1 application topically 2 (two) times daily as needed.     zolpidem 5 MG tablet  Commonly known as:  AMBIEN  Take 5 mg by mouth at bedtime as needed. For sleep           Follow-up Information    Follow up with CABBELL,KYLE L, MD In 3 weeks.   Specialty:  Neurosurgery   Why:  call the office to make an appointment   Contact information:   [address] [place] [address] [phone]       Follow up with Carmela HurtABBELL,KYLE L, MD.   Specialty:  Neurosurgery   Contact information:   [address] [place] [address] [phone]       Signed: Mariam DollarCRAM,Abriel Hattery P 03/27/2015, 8:11 AM

## 2015-03-27 NOTE — Progress Notes (Signed)
Patient ready for discharge to home; discharge instructions given and reviewed; patient discharged out via wheelchair accompanied by her daughters; Rx's already received by MD.

## 2015-03-31 ENCOUNTER — Encounter (HOSPITAL_COMMUNITY): Payer: Self-pay | Admitting: Neurosurgery
